# Patient Record
Sex: Female | Born: 1985 | Hispanic: Yes | Marital: Single | Smoking: Current some day smoker
Health system: Southern US, Community
[De-identification: ages and names within clinical notes are randomized; demographics above are authoritative.]

## PROBLEM LIST (undated history)

## (undated) VITALS — BP 117/82 | HR 80 | Temp 96.6°F | Resp 16 | Ht 62.5 in | Wt 157.0 lb

## (undated) DIAGNOSIS — F319 Bipolar disorder, unspecified: Secondary | ICD-10-CM

## (undated) DIAGNOSIS — IMO0001 Reserved for inherently not codable concepts without codable children: Secondary | ICD-10-CM

## (undated) DIAGNOSIS — F1010 Alcohol abuse, uncomplicated: Secondary | ICD-10-CM

## (undated) DIAGNOSIS — K219 Gastro-esophageal reflux disease without esophagitis: Secondary | ICD-10-CM

## (undated) DIAGNOSIS — F419 Anxiety disorder, unspecified: Secondary | ICD-10-CM

---
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
## 2003-05-04 ENCOUNTER — Other Ambulatory Visit: Admission: RE | Admit: 2003-05-04 | Discharge: 2003-05-04 | Payer: Self-pay | Admitting: Obstetrics & Gynecology

## 2003-11-28 ENCOUNTER — Inpatient Hospital Stay (HOSPITAL_COMMUNITY): Admission: AD | Admit: 2003-11-28 | Discharge: 2003-12-01 | Payer: Self-pay | Admitting: Obstetrics and Gynecology

## 2011-09-25 ENCOUNTER — Emergency Department (HOSPITAL_COMMUNITY)
Admission: EM | Admit: 2011-09-25 | Discharge: 2011-09-26 | Disposition: A | Discharge status: Other Acute Inpatient Hospital | Payer: BC Managed Care – PPO | Attending: Emergency Medicine | Admitting: Emergency Medicine

## 2011-09-25 ENCOUNTER — Encounter (HOSPITAL_COMMUNITY): Payer: Self-pay | Admitting: *Deleted

## 2011-09-25 DIAGNOSIS — F411 Generalized anxiety disorder: Secondary | ICD-10-CM | POA: Insufficient documentation

## 2011-09-25 DIAGNOSIS — F1010 Alcohol abuse, uncomplicated: Secondary | ICD-10-CM

## 2011-09-25 HISTORY — DX: Alcohol abuse, uncomplicated: F10.10

## 2011-09-25 LAB — COMPREHENSIVE METABOLIC PANEL
ALT: 10 U/L (ref 0–35)
AST: 18 U/L (ref 0–37)
Albumin: 4.1 g/dL (ref 3.5–5.2)
Alkaline Phosphatase: 85 U/L (ref 39–117)
BUN: 10 mg/dL (ref 6–23)
CO2: 27 mEq/L (ref 19–32)
Calcium: 8.9 mg/dL (ref 8.4–10.5)
Chloride: 101 mEq/L (ref 96–112)
Creatinine, Ser: 0.59 mg/dL (ref 0.50–1.10)
GFR calc Af Amer: >90 mL/min (ref >90)
GFR calc non Af Amer: >90 mL/min (ref >90)
Glucose, Bld: 80 mg/dL (ref 70–99)
Potassium: 3.6 mEq/L (ref 3.5–5.1)
Sodium: 140 mEq/L (ref 135–145)
Total Bilirubin: 0.2 mg/dL — ABNORMAL LOW (ref 0.3–1.2)
Total Protein: 7.5 g/dL (ref 6.0–8.3)

## 2011-09-25 LAB — CBC
HCT: 40.1 % (ref 36.0–46.0)
Hemoglobin: 14 g/dL (ref 12.0–15.0)
MCH: 33.5 pg (ref 26.0–34.0)
MCHC: 34.9 g/dL (ref 30.0–36.0)
MCV: 95.9 fL (ref 78.0–100.0)
Platelets: 362 10*3/uL (ref 150–400)
RBC: 4.18 MIL/uL (ref 3.87–5.11)
RDW: 12.1 % (ref 11.5–15.5)
WBC: 6.6 10*3/uL (ref 4.0–10.5)

## 2011-09-25 LAB — RAPID URINE DRUG SCREEN, HOSP PERFORMED
Amphetamines: NOT DETECTED
Barbiturates: NOT DETECTED
Benzodiazepines: NOT DETECTED
Cocaine: NOT DETECTED
Opiates: NOT DETECTED
Tetrahydrocannabinol: NOT DETECTED

## 2011-09-25 LAB — ETHANOL: Alcohol, Ethyl (B): 176 mg/dL — ABNORMAL HIGH (ref 0–11)

## 2011-09-25 LAB — ACETAMINOPHEN LEVEL: Acetaminophen (Tylenol), Serum: <15 ug/mL (ref 10–30)

## 2011-09-25 MED ORDER — FOLIC ACID 1 MG PO TABS
1.0000 mg | ORAL_TABLET | Freq: Every day | ORAL | Status: DC
  Administered 2011-09-25: 1 mg via ORAL
  Filled 2011-09-25: qty 1

## 2011-09-25 MED ORDER — ADULT MULTIVITAMIN W/MINERALS CH
1.0000 | ORAL_TABLET | Freq: Every day | ORAL | Status: DC
  Administered 2011-09-25: 1 via ORAL
  Filled 2011-09-25: qty 1

## 2011-09-25 MED ORDER — LORAZEPAM 1 MG PO TABS: 0.0000 mg | ORAL_TABLET | Freq: Two times a day (BID) | ORAL | Status: DC

## 2011-09-25 MED ORDER — VITAMIN B-1 100 MG PO TABS
100.0000 mg | ORAL_TABLET | Freq: Every day | ORAL | Status: DC
  Administered 2011-09-25: 100 mg via ORAL
  Filled 2011-09-25: qty 1

## 2011-09-25 MED ORDER — THIAMINE HCL 100 MG/ML IJ SOLN: 100.0000 mg | Freq: Every day | INTRAMUSCULAR | Status: DC

## 2011-09-25 MED ORDER — LORAZEPAM 1 MG PO TABS: 0.0000 mg | ORAL_TABLET | Freq: Four times a day (QID) | ORAL | Status: DC

## 2011-09-25 MED ORDER — LORAZEPAM 1 MG PO TABS
1.0000 mg | ORAL_TABLET | Freq: Four times a day (QID) | ORAL | Status: DC | PRN
  Administered 2011-09-25: 1 mg via ORAL
  Filled 2011-09-25: qty 1

## 2011-09-25 MED ORDER — LORAZEPAM 2 MG/ML IJ SOLN: 1.0000 mg | Freq: Four times a day (QID) | INTRAMUSCULAR | Status: DC | PRN

## 2011-09-25 NOTE — ED Notes (Signed)
Pt's mother at bedside. Visitor wanded by security and escorted back to room to visit with pt.

## 2011-09-25 NOTE — ED Notes (Signed)
Pt requesting detox from ETOH, Last drink this morning, 2-3 drinks a day habit, job requested her to come for detox also

## 2011-09-25 NOTE — ED Provider Notes (Signed)
History     CSN: [id number]  Arrival date & time 09/25/11  1545   First MD Initiated Contact with Patient 09/25/11 1652      Chief Complaint  Patient presents with  . Medical Clearance    (Consider location/radiation/quality/duration/timing/severity/associated sxs/prior treatment) HPI Comments: Patient presents today requesting alcohol detox.  She states that she drinks an average of four 16 oz alcoholic drinks daily.  Drinking has increased over the past 3 weeks.  She states that she has also had increased anxiety over the past 3 weeks.  Her boss at work had told her that she needed to have detox after she showed up at work intoxicated.  She has never been through any type of alcohol rehab or detox in the past.  No prior history of DT's.  She denies any tremors or symptoms of withdrawal at this time.  Last alcohol drink was earlier this morning.  She uses occasional marijuana, but no other recreational drugs.  She denies SI or HI.    The history is provided by the patient.    Past Medical History  Diagnosis Date  . ETOH abuse     History reviewed. No pertinent past surgical history.  No family history on file.  History  Substance Use Topics  . Smoking status: Current Everyday Smoker  . Smokeless tobacco: Not on file  . Alcohol Use: 1.8 oz/week    3 Cans of beer per week    OB History    Grav Para Term Preterm Abortions TAB SAB Ect Mult Living                  Review of Systems  Gastrointestinal: Negative for nausea and vomiting.  Neurological: Negative for tremors, syncope and headaches.  Psychiatric/Behavioral: Negative for suicidal ideas, hallucinations, confusion and dysphoric mood. The patient is nervous/anxious.     Allergies  Penicillins  Home Medications  No current outpatient prescriptions on file.  BP 122/83  Pulse 86  Temp 98.1 F (36.7 C) (Oral)  Resp 16  SpO2 96%  Physical Exam  Nursing note and vitals reviewed. Constitutional: She appears  well-developed and well-nourished. No distress.  HENT:  Head: Normocephalic and atraumatic.  Eyes: EOM are normal. Pupils are equal, round, and reactive to light.  Neck: Normal range of motion. Neck supple.  Cardiovascular: Normal rate, regular rhythm and normal heart sounds.   Pulmonary/Chest: Effort normal and breath sounds normal.  Neurological: She is alert.  Skin: Skin is warm and dry. She is not diaphoretic.  Psychiatric: She has a normal mood and affect. Her speech is normal and behavior is normal. Cognition and memory are normal. She expresses no homicidal and no suicidal ideation. She expresses no suicidal plans and no homicidal plans.    ED Course  Procedures (including critical care time)  Labs Reviewed  COMPREHENSIVE METABOLIC PANEL - Abnormal; Notable for the following:    Total Bilirubin 0.2 (*)     All other components within normal limits  ETHANOL - Abnormal; Notable for the following:    Alcohol, Ethyl (B) 176 (*)     All other components within normal limits  CBC  ACETAMINOPHEN LEVEL  URINE RAPID DRUG SCREEN (HOSP PERFORMED)   No results found.   No diagnosis found.  Discussed with ACT team.  They report that they come assess the patient.    MDM  Patient comes in today requesting detox from alcohol.  She states that she drinks 3-4 16 oz drinks/day.  She  denies SI or HI.  Patient has been discussed with ACT team who will evaluate the patient.  CIWA orders have been placed.  Psych holding orders have also been placed.          Pascal Lux [place], PA-C 09/25/11 2349

## 2011-09-26 ENCOUNTER — Inpatient Hospital Stay (HOSPITAL_COMMUNITY)
Admission: EM | Admit: 2011-09-26 | Discharge: 2011-09-29 | DRG: 430 | Disposition: A | Discharge status: Home / Self Care | Payer: BC Managed Care – PPO | Source: Ambulatory Visit | Attending: Psychiatry | Admitting: Psychiatry

## 2011-09-26 ENCOUNTER — Encounter (HOSPITAL_COMMUNITY): Payer: Self-pay

## 2011-09-26 DIAGNOSIS — F17200 Nicotine dependence, unspecified, uncomplicated: Secondary | ICD-10-CM | POA: Diagnosis present

## 2011-09-26 DIAGNOSIS — F1210 Cannabis abuse, uncomplicated: Secondary | ICD-10-CM | POA: Diagnosis present

## 2011-09-26 DIAGNOSIS — F310 Bipolar disorder, current episode hypomanic: Secondary | ICD-10-CM | POA: Diagnosis present

## 2011-09-26 DIAGNOSIS — F1010 Alcohol abuse, uncomplicated: Secondary | ICD-10-CM | POA: Diagnosis present

## 2011-09-26 DIAGNOSIS — F3110 Bipolar disorder, current episode manic without psychotic features, unspecified: Principal | ICD-10-CM | POA: Diagnosis present

## 2011-09-26 LAB — PREGNANCY, URINE: Preg Test, Ur: NEGATIVE

## 2011-09-26 MED ORDER — ADULT MULTIVITAMIN W/MINERALS CH
1.0000 | ORAL_TABLET | Freq: Every day | ORAL | Status: DC
  Administered 2011-09-26 – 2011-09-29 (×4): 1 via ORAL
  Filled 2011-09-26 (×5): qty 1

## 2011-09-26 MED ORDER — ACETAMINOPHEN 325 MG PO TABS: 650.0000 mg | ORAL_TABLET | Freq: Four times a day (QID) | ORAL | Status: DC | PRN

## 2011-09-26 MED ORDER — DIVALPROEX SODIUM ER 500 MG PO TB24
500.0000 mg | ORAL_TABLET | Freq: Two times a day (BID) | ORAL | Status: DC
  Administered 2011-09-26 – 2011-09-29 (×7): 500 mg via ORAL
  Filled 2011-09-26 (×9): qty 1

## 2011-09-26 MED ORDER — ONDANSETRON 4 MG PO TBDP: 4.0000 mg | ORAL_TABLET | Freq: Four times a day (QID) | ORAL | Status: AC | PRN

## 2011-09-26 MED ORDER — NICOTINE 21 MG/24HR TD PT24: 21.0000 mg | MEDICATED_PATCH | Freq: Every day | TRANSDERMAL | Status: DC

## 2011-09-26 MED ORDER — MAGNESIUM HYDROXIDE 400 MG/5ML PO SUSP: 30.0000 mL | Freq: Every day | ORAL | Status: DC | PRN

## 2011-09-26 MED ORDER — HYDROXYZINE HCL 25 MG PO TABS
25.0000 mg | ORAL_TABLET | Freq: Four times a day (QID) | ORAL | Status: AC | PRN
  Administered 2011-09-27: 25 mg via ORAL

## 2011-09-26 MED ORDER — THIAMINE HCL 100 MG/ML IJ SOLN
100.0000 mg | Freq: Once | INTRAMUSCULAR | Status: AC
  Administered 2011-09-26: 100 mg via INTRAMUSCULAR

## 2011-09-26 MED ORDER — CHLORDIAZEPOXIDE HCL 25 MG PO CAPS
50.0000 mg | ORAL_CAPSULE | Freq: Once | ORAL | Status: AC
  Administered 2011-09-26: 50 mg via ORAL
  Filled 2011-09-26: qty 2

## 2011-09-26 MED ORDER — VITAMIN B-1 100 MG PO TABS
100.0000 mg | ORAL_TABLET | Freq: Every day | ORAL | Status: DC
  Administered 2011-09-27 – 2011-09-29 (×3): 100 mg via ORAL
  Filled 2011-09-26 (×4): qty 1

## 2011-09-26 MED ORDER — LOPERAMIDE HCL 2 MG PO CAPS: 2.0000 mg | ORAL_CAPSULE | ORAL | Status: AC | PRN

## 2011-09-26 MED ORDER — CHLORDIAZEPOXIDE HCL 25 MG PO CAPS
25.0000 mg | ORAL_CAPSULE | Freq: Every day | ORAL | Status: AC
  Administered 2011-09-29: 25 mg via ORAL
  Filled 2011-09-26: qty 1

## 2011-09-26 MED ORDER — CHLORDIAZEPOXIDE HCL 25 MG PO CAPS
25.0000 mg | ORAL_CAPSULE | Freq: Three times a day (TID) | ORAL | Status: AC
  Administered 2011-09-27 (×3): 25 mg via ORAL
  Filled 2011-09-26 (×2): qty 1

## 2011-09-26 MED ORDER — TRAZODONE HCL 50 MG PO TABS: 50.0000 mg | ORAL_TABLET | Freq: Every evening | ORAL | Status: DC | PRN

## 2011-09-26 MED ORDER — CHLORDIAZEPOXIDE HCL 25 MG PO CAPS: 25.0000 mg | ORAL_CAPSULE | Freq: Four times a day (QID) | ORAL | Status: AC | PRN

## 2011-09-26 MED ORDER — ALUM & MAG HYDROXIDE-SIMETH 200-200-20 MG/5ML PO SUSP: 30.0000 mL | ORAL | Status: DC | PRN

## 2011-09-26 MED ORDER — CHLORDIAZEPOXIDE HCL 25 MG PO CAPS
25.0000 mg | ORAL_CAPSULE | ORAL | Status: AC
  Administered 2011-09-28 (×2): 25 mg via ORAL
  Filled 2011-09-26 (×2): qty 1

## 2011-09-26 MED ORDER — CHLORDIAZEPOXIDE HCL 25 MG PO CAPS
25.0000 mg | ORAL_CAPSULE | Freq: Four times a day (QID) | ORAL | Status: AC
  Administered 2011-09-26 (×2): 25 mg via ORAL
  Filled 2011-09-26 (×3): qty 1

## 2011-09-26 NOTE — BHH Counselor (Signed)
Adult Comprehensive Assessment  Patient ID: Tanya Wilson, female   DOB: [birth year], 26 y.o.   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 Family Relationships: Some strain due to patient drinking Surveyor, quantity / Lack of resources (include bankruptcy): NA Housing / Lack of housing: NA Physical health (include injuries & life threatening diseases): nothing Social relationships: isolates Substance abuse: history Bereavement / Loss: GP, Aunts  Living/Environment/Situation:  Living Arrangements: Spouse/significant other and son Living conditions (as described by patient or guardian): likes the home  How long has patient lived in current situation?: 5 years What is atmosphere in current home: Comfortable  Family History:  Marital status: Long term relationship Long term relationship, how long?: 8 years What types of issues is patient dealing with in the relationship?: Patient's drinking Additional relationship information: Engaged, feels like her drinking may change that if she does not stop Does patient have children?: Yes How many children?: 1  How is patient's relationship with their children?: Good w 7 YO son  Childhood History:  By whom was/is the patient raised?: Both parents Additional childhood history information: "Perfect childhood although they may have spoiled me" Description of patient's relationship with caregiver when they were a child: Good Patient's description of current relationship with people who raised him/her: "Get along really well w both; they are concerned about my drinking" Does patient have siblings?: Yes Number of Siblings: 2  Description of patient's current relationship with siblings: "Good with one; other strained due to her disapproval of my drinking" Did patient suffer any  verbal/emotional/physical/sexual abuse as a child?: No Did patient suffer from severe childhood neglect?: No Has patient ever been sexually abused/assaulted/raped as an adolescent or adult?: No Was the patient ever a victim of a crime or a disaster?: No Witnessed domestic violence?: No Has patient been effected by domestic violence as an adult?: No  Education:  Highest grade of school patient has completed: some college Currently a Consulting civil engineer?: No Learning disability?: No  Employment/Work Situation:   Employment situation: Employed 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) How long has patient been employed?: 2 years  Patient's job has been impacted by current illness: Yes Describe how patient's job has been impacted: "Told not to return unless I complete detox as I was caught with alcohol on my breath 3 times in last 2 months" What is the longest time patient has a held a job?: 5 years Where was the patient employed at that time?: [employer] Has patient ever been in the [employer]?: No Has patient ever served in combat?: No  Financial Resources:   Financial resources: Income from employment;Income from spouse  Alcohol/Substance Abuse:   What has been your use of drugs/alcohol within the last 12 months?: Last month or more patient drinking 4 16 oz daily plus one 40 oz every other day If attempted suicide, did drugs/alcohol play a role in this?: No Alcohol/Substance Abuse Treatment Hx: Denies past history Has alcohol/substance abuse ever caused legal problems?: Yes (2 DUIs)  Social Support System:   Patient's Community Support System: Good Describe Community Support System: Josh and family Type of faith/religion: Catholic How does patient's faith help to cope with current illness?: Open minded  Leisure/Recreation:   Leisure and Hobbies: "Shopping"  Strengths/Needs:   What things does the patient do well?: "Good cook, good speaker, communicates well" In what  areas does patient  struggle / problems for patient: "Difficulty dealing with emotions, especially anger; alcohol"  Discharge Plan:   Does patient have access to transportation?: Yes Will patient be returning to same living situation after discharge?: Yes Currently receiving community mental health services: No If no, would patient like referral for services when discharged?: Yes (What county[place] [facility] [place]) Does patient have financial barriers related to discharge medications?: No  Summary/Recommendations:   Summary and Recommendations (to be completed by the evaluator): Patient is 26 YO married and employed female admitted with diagnosis of Alcohol Abuse.  Patient reports he drinking is having negative effect on her self esteem, marriage and employment. Patient will benefit from crisis stabilization, medication evaluation, group therapy and psycho ed groups, in addition to case management for discharge planning.   Clide Dales. 09/26/2011

## 2011-09-26 NOTE — Progress Notes (Signed)
Psychoeducational Group Note  Date:  09/26/2011 Time: 1000  Group Topic/Focus:  Recovery Goals:   The focus of this group is to identify appropriate goals for recovery and establish a plan to achieve them.  Participation Level: Did Not Attend  Participation Quality:  Not Applicable  Affect:  Not Applicable  Cognitive:  Not Applicable  Insight:  Not Applicable  Engagement in Group: Not Applicable  Additional Comments:  Pt did not attend group. New admission on the unit.  Tanya Wilson Brittini 09/26/2011, 3:23 PM

## 2011-09-26 NOTE — BHH Counselor (Signed)
Pt has been accepted by Donell Sievert, PA- Dr. Catha Brow, [phone].

## 2011-09-26 NOTE — Tx Team (Signed)
Interdisciplinary Treatment Plan Update (Adult)  Date:  09/26/2011  Time Reviewed:  10:57 AM   Progress in Treatment: Attending groups: Yes Participating in groups:  Yes Taking medication as prescribed: Yes Tolerating medication:  Yes Family/Significant othe contact made:  Counselor assessing for appropriate contact Patient understands diagnosis:  Yes Discussing patient identified problems/goals with staff:  Yes Medical problems stabilized or resolved:  Yes Denies suicidal/homicidal ideation: Yes Issues/concerns per patient self-inventory:  None identified Other: N/A  New problem(s) identified: None Identified  Reason for Continuation of Hospitalization: Anxiety Medication stabilization Withdrawal symptoms  Interventions implemented related to continuation of hospitalization: mood stabilization, medication monitoring and adjustment, group therapy and psycho education, safety checks q 15 mins  Additional comments: N/A  Estimated length of stay: 3-5 days  Discharge Plan: SW assessing for appropriate contact  New goal(s): N/A  Review of initial/current patient goals per problem list:    1.  Goal(s): Address substance use  Met:  No  Target date: by discharge  As evidenced by: completing detox protocol and refer to appropriate treatment  2.  Goal (s): Reduce anxiety symptoms  Met:  No  Target date: by discharge  As evidenced by: Reducing depression from a 10 to a 3 as reported by pt.     Attendees: Patient:  Tanya Wilson 09/26/2011 10:57 AM   Family:     Physician:  Lupe Carney, DO 09/26/2011 10:57 AM   Nursing: Roswell Miners, RN 09/26/2011 10:57 AM   Case Manager:  Reyes Ivan, LCSWA 09/26/2011  10:57 AM   Counselor:  Ronda Fairly, LCSWA 09/26/2011  10:57 AM   Other:  Richelle Ito, LCSW 09/26/2011 10:57 AM   Other:  Robbie Louis, RN 09/26/2011 10:57 AM   Other:  Caroleen Hamman, NP 09/26/2011 10:57 AM   Other:      Scribe for Treatment Team:   Reyes Ivan 09/26/2011 10:57 AM

## 2011-09-26 NOTE — BH Assessment (Addendum)
Assessment Note   Tanya Wilson is a 26 y.o. female who presents to [facility] for detox from alcohol.  Pt denies SI/HI/Psych.  Pt says drinks 4-16oz cans of beer and 1-40oz every other day with the beer, pt also uses THC, stating she only takes 2 "hits" 2x's wkly.  Pt reports she is experiencing tremors, chills and fever. Pt.'s last drink was 09/25/11, she had 1-16oz can of beer.  Pt says she has been intoxicated on her job for the last 2-3 wks.  Pt.'s supervisor(pt is employed by [employer] x28yrs) has caught pt intoxicated at least 3x's for the last 2-3wks and told her she needed detox. Pt has no legal issues due to alcohol abuse and motivated to seek detox to keep her job.  Pt has no hx of detox tx.   Axis I: Alcohol Abuse Axis II: Deferred Axis III:  Past Medical History  Diagnosis Date  . ETOH abuse    Axis IV: other psychosocial or environmental problems, problems related to social environment and problems with primary support group Axis V: 51-60 moderate symptoms  Past Medical History:  Past Medical History  Diagnosis Date  . ETOH abuse     History reviewed. No pertinent past surgical history.  Family History: No family history on file.  Social History:  reports that she has been smoking.  She does not have any smokeless tobacco history on file. She reports that she drinks about 1.8 ounces of alcohol per week. She reports that she uses illicit drugs (Marijuana).  Additional Social History:  Alcohol / Drug Use Pain Medications: None  Prescriptions: None  Over the Counter: None  History of alcohol / drug use?: Yes Longest period of sobriety (when/how long): None  Substance #1 Name of Substance 1: Alcohol  1 - Age of First Use: Teens  1 - Amount (size/oz): 4-16oz; 1-40oz every other day  1 - Frequency: Daily  1 - Duration: On-going  1 - Last Use / Amount: 09/25/11 Substance #2 Name of Substance 2: THC  2 - Age of First Use: Teens  2 - Amount (size/oz): 2  "Hits" 2 - Frequency: 2x's wkly  2 - Duration: On-going  2 - Last Use / Amount: Unk   CIWA: CIWA-Ar BP: 126/88 mmHg Pulse Rate: 67  Nausea and Vomiting: no nausea and no vomiting Tactile Disturbances: none Tremor: not visible, but can be felt fingertip to fingertip Auditory Disturbances: not present Paroxysmal Sweats: no sweat visible Visual Disturbances: not present Anxiety: no anxiety, at ease Headache, Fullness in Head: none present Agitation: normal activity Orientation and Clouding of Sensorium: oriented and can do serial additions CIWA-Ar Total: 1  COWS:    Allergies:  Allergies  Allergen Reactions  . Penicillins Hives    Home Medications:  (Not in a hospital admission)  OB/GYN Status:  No LMP recorded. Patient is not currently having periods (Reason: IUD).  General Assessment Data Location of Assessment: [facility] Living Arrangements: Alone Can pt return to current living arrangement?: Yes Admission Status: Voluntary Is patient capable of signing voluntary admission?: Yes Transfer from: Acute Hospital Referral Source: MD  Education Status Is patient currently in school?: No Current Grade: None  Highest grade of school patient has completed: None  Name of school: None  Contact person: None   Risk to self Suicidal Ideation: No Suicidal Intent: No Is patient at risk for suicide?: No Suicidal Plan?: No Access to Means: No What has been your use of drugs/alcohol within the last 12  months?: Abusing Alcohol & THC  Previous Attempts/Gestures: No How many times?: 0  Other Self Harm Risks: None  Triggers for Past Attempts: None known Intentional Self Injurious Behavior: None Family Suicide History: No Recent stressful life event(s): Other (Comment) (Issues with intoxication at work ) Persecutory voices/beliefs?: No Depression: No Depression Symptoms:  (None Reported ) Substance abuse history and/or treatment for substance abuse?: No Suicide prevention  information given to non-admitted patients: Not applicable  Risk to Others Homicidal Ideation: No Thoughts of Harm to Others: No Current Homicidal Intent: No Current Homicidal Plan: No Access to Homicidal Means: No Identified Victim: None  History of harm to others?: No Assessment of Violence: None Noted Violent Behavior Description: None  Does patient have access to weapons?: No Criminal Charges Pending?: No Does patient have a court date: No  Psychosis Hallucinations: None noted Delusions: None noted  Mental Status Report Appear/Hygiene: Other (Comment) (Appropriate ) Eye Contact: Good Motor Activity: Unremarkable Speech: Logical/coherent Level of Consciousness: Alert Mood: Other (Comment) (Appropriate ) Affect: Appropriate to circumstance Anxiety Level: None Thought Processes: Coherent;Relevant Judgement: Unimpaired Orientation: Person;Place;Time;Situation Obsessive Compulsive Thoughts/Behaviors: None  Cognitive Functioning Concentration: Normal Memory: Recent Intact;Remote Intact IQ: Average Insight: Good Impulse Control: Good Appetite: Good Weight Loss: 0  Weight Gain: 0  Sleep: No Change Total Hours of Sleep: 6  Vegetative Symptoms: None  ADLScreening [facility] Assessment Services) Patient's cognitive ability adequate to safely complete daily activities?: Yes Patient able to express need for assistance with ADLs?: Yes Independently performs ADLs?: Yes (appropriate for developmental age)  Abuse/Neglect [facility]) Physical Abuse: Denies Verbal Abuse: Denies Sexual Abuse: Denies  Prior Inpatient Therapy Prior Inpatient Therapy: No Prior Therapy Dates: None  Prior Therapy Facilty/Provider(s): None  Reason for Treatment: None   Prior Outpatient Therapy Prior Outpatient Therapy: No Prior Therapy Dates: None  Prior Therapy Facilty/Provider(s): None  Reason for Treatment: None   ADL Screening (condition at time of admission) Patient's cognitive ability  adequate to safely complete daily activities?: Yes Patient able to express need for assistance with ADLs?: Yes Independently performs ADLs?: Yes (appropriate for developmental age) Weakness of Legs: None Weakness of Arms/Hands: None  Home Assistive Devices/Equipment Home Assistive Devices/Equipment: None  Therapy Consults (therapy consults require a physician order) PT Evaluation Needed: No OT Evalulation Needed: No SLP Evaluation Needed: No Abuse/Neglect Assessment (Assessment to be complete while patient is alone) Physical Abuse: Denies Verbal Abuse: Denies Sexual Abuse: Denies Exploitation of patient/patient's resources: Denies Self-Neglect: Denies Values / Beliefs Cultural Requests During Hospitalization: None Spiritual Requests During Hospitalization: None Consults Spiritual Care Consult Needed: No Social Work Consult Needed: No Merchant navy officer (For Healthcare) Advance Directive: Patient does not have advance directive;Patient would not like information Pre-existing out of facility DNR order (yellow form or pink MOST form): No Nutrition Screen- [facility] Patient's home diet: Regular Have you recently lost weight without trying?: No Have you been eating poorly because of a decreased appetite?: No Malnutrition Screening Tool Score: 0   Additional Information 1:1 In Past 12 Months?: No CIRT Risk: No Elopement Risk: No Does patient have medical clearance?: Yes     Disposition:  Disposition Disposition of Patient: Inpatient treatment program;Referred to [facility] ) Type of inpatient treatment program: Adult Patient referred to:  [facility] )  On Site Evaluation by:   Reviewed with Physician:     Murrell Redden 09/26/2011 1:59 AM

## 2011-09-26 NOTE — H&P (Signed)
Psychiatric Admission Assessment Adult  Patient Identification:  Tanya Wilson  Date of Evaluation:  09/26/2011  Chief Complaint:  Alcohol Abuse  History of Present Illness: This is a 26 year old Hispanic female, admitted to [facility] from the [facility] ED with complaints of alcohol abuse and binging, requesting a detoxification treatment. Patient reports, "My Mom took me to the [facility] ED yesterday because I have been dangerously alcohol binging for the past 3 weeks. I guess I was stressed from my job. 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 And to relieve these stresses, I binge drink alcohol. I have been drinking alcohol since I was 26 years old. My drinking has escalated to the point that I'm not only drinking daily, I also drink at work. My supervisor has ordered me to get detoxification treatment or else, I would not have a job. When I drink, I don't feel my stress, I feel happier, and I don't worry about anything else. I lost my driver's license x 1 year now. I have to go the [facility] have it reinstated. But, I know if it is reinstated, it will not be for an extended amount of time before I will lose it again because I have not stooped drinking alcohol still. I have anger problems too. And my fiance knows how to instigate something to get me upset. When I get angry, I will turn to drinking alcohol to stay calm. I was diagnosed with bipolar disorder when I was a teenager. At the time, I saw a psychiatrist who put me on medications. I don't remember the name of the psychiatrist or the medications that I was on. I am not depressed, but sad. I do have mood swings, happy one minute, depressingly sad the next. I have problems sleeping at night because of racing thoughts. I am not suicidal, homicidal or delusional. I don't see or hear stuff".  ROS: Negative for fever.  HENT: Negative for congestion and rhinorrhea.  Respiratory: Negative for cough, chest tightness and shortness of  breath.  Cardiovascular: Negative for chest pain.  Gastrointestinal: Negative for nausea, vomiting and abdominal pain.  Skin: Negative for rash. Multiple body piecings, mostly facial areas. Neurological: Negative for weakness and headaches     Mood Symptoms:  Hypomania/Mania, Mood Swings, Sadness,  Depression Symptoms:  anxiety, insomnia, racing thoughts  (Hypo) Manic Symptoms:  Impulsivity, Irritable Mood,  Anxiety Symptoms:  Excessive Worry,  Psychotic Symptoms:  Hallucinations: None  PTSD Symptoms: Had a traumatic exposure:  Patient denies any traumatic events  Past Psychiatric History: Diagnosis: Alcohol abuse continuous, Hx. Bipolar disorder.  Hospitalizations: [facility]  Outpatient Care: "I don't have one"  Substance Abuse Care: None reported  Self-Mutilation: Denies self mutilation.  Suicidal Attempts: Denies attempts and or thoughts.  Violent Behaviors: Denies any violent behaviors.    Past Medical History:   Past Medical History  Diagnosis Date  . ETOH abuse      Allergies:   Allergies  Allergen Reactions  . Penicillins Hives   PTA Medications: No prescriptions prior to admission     Substance Abuse History in the last 12 months: Substance Age of 1st Use Last Use Amount Specific Type  Nicotine 14 "I only smoke if I'm drinking alcohol" 8 cigarettes daily Cigarettes  Alcohol 13 Prior to hosp 4 cans of malt-liquor daily Liquor  Cannabis  14 "I smoke occasionally, may be 2 hits at a time" 2 hits occasionally THC  Opiates Denies use     Cocaine  Denies use     Methamphetamines Denies use     LSD Denies use     Ecstasy Denies use     Benzodiazepines Denies use     Caffeine      Inhalants      Others:                         Consequences of Substance Abuse: Medical Consequences:  Liver damage Legal Consequences:  Arrests, jail time Family Consequences:  Family discord  Social History: Current Place of Residence: [place], [place]  Place of Birth:  [place], [place]   Family Members: "I have a son, and my fiance"  Marital Status:  Single  Children:1  Sons:1  Daughters:0  Relationships: "I have a fiance"  Education:  HS Graduate  Educational Problems/Performance: None reported  Religious Beliefs/Practices: None reported  History of Abuse (Emotional/Phsycial/Sexual): none reported  Occupational Experiences: Employed  Hotel manager History:  None.  Legal History: "Yes, DUI charges x 2, lost my driver's license x 1 year'  Hobbies/Interests: None reported.  Family History:  History reviewed. No pertinent family history.  Mental Status Examination/Evaluation: Objective:  Appearance: Casual  Eye Contact::  Good  Speech:  Clear and Coherent  Volume:  Normal  Mood:  "I am not depressed, rather sad"  Affect:  Appropriate  Thought Process:  Coherent, Intact and Logical  Orientation:  Full  Thought Content:  Denies delusions, and or hallucinations.  Suicidal Thoughts:  No  Homicidal Thoughts:  No  Memory:  Immediate;   Good Recent;   Good Remote;   Good  Judgement:  Poor  Insight:  Good  Psychomotor Activity:  Normal  Concentration:  Good  Recall:  Good  Akathisia:  No  Handed:  Right  AIMS (if indicated):     Assets:  Desire for Improvement  Sleep:       Laboratory/X-Ray: None Psychological Evaluation(s)      Assessment:    AXIS I:  Alcohol abuse continuous. AXIS II:  Deferred AXIS III:   Past Medical History  Diagnosis Date  . ETOH abuse    AXIS IV:  Substance abuse problems. AXIS V:  41-50 serious symptoms  Treatment Plan/Recommendations: Admit for safety and stabilization. Review and reinstate any pertinent home medications for other medical issues. Initiate Librium protocol for alcohol detoxification. Start Depakote ER 500 mg bid for mood stabilizations. Encourage and allow patient participate in AA/NA meetings being offered on this unit.  Treatment Plan Summary: Daily contact with patient to  assess and evaluate symptoms and progress in treatment Medication management  Current Medications:  No current facility-administered medications for this encounter.   Facility-Administered Medications Ordered in Other Encounters  Medication Dose Route Frequency Provider Last Rate Last Dose  . DISCONTD: acetaminophen (TYLENOL) tablet 650 mg  650 mg Oral Q6H PRN Kerry Hough, PA      . DISCONTD: alum & mag hydroxide-simeth (MAALOX/MYLANTA) 200-200-20 MG/5ML suspension 30 mL  30 mL Oral Q4H PRN Kerry Hough, PA      . DISCONTD: folic acid (FOLVITE) tablet 1 mg  1 mg Oral Daily Pascal Lux Wingen, PA-C   1 mg at 09/25/11 1828  . DISCONTD: LORazepam (ATIVAN) injection 1 mg  1 mg Intravenous Q6H PRN Pascal Lux Wingen, PA-C      . DISCONTD: LORazepam (ATIVAN) tablet 0-4 mg  0-4 mg Oral Q6H Heather Van Wingen, PA-C      . DISCONTD: LORazepam (ATIVAN) tablet 0-4 mg  0-4 mg Oral Q12H Heather Van Croom, PA-C      . DISCONTD: LORazepam (ATIVAN) tablet 1 mg  1 mg Oral Q6H PRN Pascal Lux Wingen, PA-C   1 mg at 09/25/11 2220  . DISCONTD: magnesium hydroxide (MILK OF MAGNESIA) suspension 30 mL  30 mL Oral Daily PRN Kerry Hough, PA      . DISCONTD: multivitamin with minerals tablet 1 tablet  1 tablet Oral Daily Magnus Sinning, PA-C   1 tablet at 09/25/11 1828  . DISCONTD: nicotine (NICODERM CQ - dosed in mg/24 hours) patch 21 mg  21 mg Transdermal Q0600 Kerry Hough, PA      . DISCONTD: thiamine (B-1) injection 100 mg  100 mg Intravenous Daily Pascal Lux Wingen, PA-C      . DISCONTD: thiamine (VITAMIN B-1) tablet 100 mg  100 mg Oral Daily Pascal Lux Wingen, PA-C   100 mg at 09/25/11 1828  . DISCONTD: traZODone (DESYREL) tablet 50 mg  50 mg Oral QHS,MR X 1 Kerry Hough, PA        Observation Level/Precautions:  Q 15 minute checks for safety  Laboratory:  Per ED lab findinds, (+) for alcohol level of 176.  Psychotherapy:  Group counseling sessions  Medications: See medication lists     Routine PRN Medications:  Yes  Consultations:  None indicated at this time.  Discharge Concerns:  Safety  Other:     Armandina Stammer I 9/3/201310:11 AM

## 2011-09-26 NOTE — ED Notes (Signed)
ACT at bedside 

## 2011-09-26 NOTE — [facility] Suicide Risk Assessment (Signed)
Suicide Risk Assessment  Admission Assessment      Demographic factors:  Adolescent or young adult (adult)  Current Mental Status: Patient seen and evaluated. Chart reviewed. Patient stated that her mood was "ok". Her affect was mood congruent and appropriate. She denied any current thoughts of self injurious behavior, suicidal ideation or homicidal ideation. There were no auditory or visual hallucinations, paranoia, delusional thought processes, or mania noted.  Thought process was linear and goal directed.  No psychomotor agitation or retardation was noted. Speech was normal rate, tone and volume. Eye contact was good. Judgment and insight are fair.  Patient has been up and engaged on the unit.  No acute safety concerns reported from team.    Loss Factors: trouble with employer over use at work  Historical Factors:  Denied sig medical problems; no hx w/d seizures or DTs; no recent meds; BPAD during teenage yrs noted  Risk Reduction Factors:  Responsible for children under 74 years of age;Sense of responsibility to family;Employed;Living with another person, especially a relative;Positive social support;Positive therapeutic relationship; child and sig other; employment  CLINICAL FACTORS: Alcohol Use Disorder; Cannabis Use Disorder, mild; Mood Disorder Unspecified; BPAD, per Hx    COGNITIVE FEATURES THAT CONTRIBUTE TO RISK: limited insight; impulsivity.  SUICIDE RISK: Pt viewed as a chronic increased risk of harm to self in light of her past hx and risk factors.  No acute safety concerns on the unit.  Pt contracting for safety and in need of crisis stabilization & Tx.  PLAN OF CARE: Pt admitted for crisis stabilization and treatment.  Please see orders.   Medications reviewed with pt and medication education provided.  Will continue q15 minute checks per unit protocol.  No clinical indication for one on one level of observation at this time.  Pt contracting for safety.  Mental health treatment,  medication management and continued sobriety will mitigate against the increased risk of harm to self and/or others.  Discussed the importance of recovery with pt, as well as, tools to move forward in a healthy & safe manner.  Pt agreeable with the plan.  Discussed with the team.   Lupe Carney 09/26/2011, 5:05 PM

## 2011-09-26 NOTE — ED Provider Notes (Signed)
  Physical Exam  BP 138/87  Pulse 68  Temp 97.7 F (36.5 C) (Oral)  Resp 16  SpO2 99%  Physical Exam  ED Course  Procedures  MDM The patient has been accepted in behavioral health, transfer documentation completed      Vida Roller, MD 09/26/11 [phone]

## 2011-09-26 NOTE — Progress Notes (Signed)
Pt attended discharge planning group and actively participated in group.  SW provided pt with today's workbook.  Pt presents with flat affect and depressed mood.  Pt was open with sharing reason for entering the hospital.  Pt states that she was drinking too much and was going to work under the influence.  Pt states that her work supports her being here and getting help.  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  Pt states that she doesn't want rehab but is open with going to [facility].  SW gave pt information on [facility] at [facility].  SW will assess for appropriate referrals.  Pt states that she lives in [place] with her fiance and son.  Pt states that she has transportation home.  Pt denies having depression and SI/HI.  Pt rates anxiety at an 8 today.  No further needs voiced by pt at this time.  Safety planning and suicide prevention discussed.  Pt participated in discussion and acknowledged an understanding of the information provided.       Sajan Cheatwood Horton, LCSWA 09/26/2011  10:00 AM

## 2011-09-26 NOTE — H&P (Signed)
Pt seen and evaluated upon admission.  Completed Admission Suicide Risk Assessment.  See orders.  Pt agreeable with plan.  Discussed with team.   

## 2011-09-26 NOTE — Progress Notes (Signed)
[facility] Group Notes:  (Counselor/Nursing/MHT/Case Management/Adjunct)  09/26/2011 3:50 PM  Type of Therapy:  Psychoeducational Skills  Participation Level:  Minimal  Participation Quality:  Appropriate and Attentive  Affect:  Blunted  Cognitive:  Alert, Appropriate and Oriented  Insight:  Good  Engagement in Group:  Limited  Engagement in Therapy:  n/a  Modes of Intervention:  Activity, Education, Problem-solving, Socialization and Support  Summary of Progress/Problems: Tanya Wilson attended Psychoeducational group that focused on using quality time with support systems/individuals to engage in healthy coping skills. Tanya Wilson participated in activity guessing about self and peers. Tanya Wilson was gaurded while group discussed who their supports are, how they can spend positive quality time with them as a coping skill and a way to strengthen their relationship. She was given a homework assignment to find two ways to improve her support systems and 20 activities she can do to spend quality time with supports.    Tanya Wilson 09/26/2011, 3:50 PM

## 2011-09-26 NOTE — ED Notes (Signed)
Waiting for Urine Pregnancy to result before transporting pt to [facility]

## 2011-09-26 NOTE — Progress Notes (Signed)
09/26/2011         Time: 1500      Group Topic/Focus: The focus of the group is on enhancing the patients' ability to utilize positive relaxation strategies by practicing several that can be used at discharge.  Participation Level: Active  Participation Quality: Appropriate and Attentive  Affect: Appropriate  Cognitive: Oriented   Additional Comments: Patient reports being very tired, but still attended group. Patient practiced relaxation techniques, was able to identify positive techniques she can use upon discharge to help relax.   Renda Pohlman 09/26/2011 3:50 PM

## 2011-09-27 DIAGNOSIS — F1010 Alcohol abuse, uncomplicated: Secondary | ICD-10-CM

## 2011-09-27 NOTE — Progress Notes (Signed)
Read and reviewed. 

## 2011-09-27 NOTE — Progress Notes (Signed)
[facility] In Patient Progress Note  09/27/2011 2:49 PM Tanya Wilson [birth year] [id number]  Diagnosis:  Axis I: Alcohol Use Disorder; Cannabis Use Disorder, mild; Mood Disorder Unspecified; BPAD, per Hx   ADL's:  Intact  Sleep:  Yes,  AEB:  Appetite: good  Suicidal Ideation: No suicidal ideation, no plan, no intent, no means.  Homicidal Ideation:  No homicidal ideation, no plan, no intent, no means.  Subjective: The patient is up and active in the unit milieu.  She is doing well and has no new complaints. She is anticipating discharge tomorrow or the next day. She reports no symptoms of withdrawal at this time.   height is 5' 2.5" (1.588 m) and weight is 71.215 kg (157 lb). Her oral temperature is 97.9 F (36.6 C). Her blood pressure is 112/79 and her pulse is 74. Her respiration is 16.   Objective: the patient is alert and oriented, reports no new symptoms of withdrawal. She has considered the possibility of going into [facility] upon discharge if her employer will support her doing this. Mental Status: awake Level of Consciousness:  Alert Orientation: x 3 General Appearance : neat and groomed Behavior:  cooperative Eye Contact:  Good Motor Behavior:  Tremor Speech:  Normal Mood:  Euthymic Affect:  Appropriate Anxiety Level:  None Thought Process:  Coherent Thought Content:  WNL Perception:  Normal Judgment:  Fair Insight:  Present Cognition:  At least average Sleep:  Number of Hours: 6.25   Lab Results:  Results for orders placed during the hospital encounter of 09/25/11 (from the past 48 hour(s))  CBC     Status: Normal   Collection Time   09/25/11  4:15 PM      Component Value Range Comment   WBC 6.6  4.0 - 10.5 K/uL    RBC 4.18  3.87 - 5.11 MIL/uL    Hemoglobin 14.0  12.0 - 15.0 g/dL    HCT 40.9  81.1 - 91.4 %    MCV 95.9  78.0 - 100.0 fL    MCH 33.5  26.0 - 34.0 pg    MCHC 34.9  30.0 - 36.0 g/dL    RDW 78.2  95.6 - 21.3 %    Platelets 362  150 - 400 K/uL     COMPREHENSIVE METABOLIC PANEL     Status: Abnormal   Collection Time   09/25/11  4:15 PM      Component Value Range Comment   Sodium 140  135 - 145 mEq/L    Potassium 3.6  3.5 - 5.1 mEq/L    Chloride 101  96 - 112 mEq/L    CO2 27  19 - 32 mEq/L    Glucose, Bld 80  70 - 99 mg/dL    BUN 10  6 - 23 mg/dL    Creatinine, Ser 0.86  0.50 - 1.10 mg/dL    Calcium 8.9  8.4 - 57.8 mg/dL    Total Protein 7.5  6.0 - 8.3 g/dL    Albumin 4.1  3.5 - 5.2 g/dL    AST 18  0 - 37 U/L    ALT 10  0 - 35 U/L    Alkaline Phosphatase 85  39 - 117 U/L    Total Bilirubin 0.2 (*) 0.3 - 1.2 mg/dL    GFR calc non Af Amer >90  >90 mL/min    GFR calc Af Amer >90  >90 mL/min   ETHANOL     Status: Abnormal   Collection Time  09/25/11  4:15 PM      Component Value Range Comment   Alcohol, Ethyl (B) 176 (*) 0 - 11 mg/dL   ACETAMINOPHEN LEVEL     Status: Normal   Collection Time   09/25/11  4:15 PM      Component Value Range Comment   Acetaminophen (Tylenol), Serum <15.0  10 - 30 ug/mL   URINE RAPID DRUG SCREEN (HOSP PERFORMED)     Status: Normal   Collection Time   09/25/11  4:49 PM      Component Value Range Comment   Opiates NONE DETECTED  NONE DETECTED    Cocaine NONE DETECTED  NONE DETECTED    Benzodiazepines NONE DETECTED  NONE DETECTED    Amphetamines NONE DETECTED  NONE DETECTED    Tetrahydrocannabinol NONE DETECTED  NONE DETECTED    Barbiturates NONE DETECTED  NONE DETECTED   PREGNANCY, URINE     Status: Normal   Collection Time   09/26/11  4:20 AM      Component Value Range Comment   Preg Test, Ur NEGATIVE  NEGATIVE    Labs are reviewed. Physical Findings: AIMS: Facial and Oral Movements Muscles of Facial Expression: None, normal Lips and Perioral Area: None, normal Jaw: None, normal Tongue: None, normal,Extremity Movements Upper (arms, wrists, hands, fingers): None, normal Lower (legs, knees, ankles, toes): None, normal, Trunk Movements Neck, shoulders, hips: None, normal, Overall  Severity Severity of abnormal movements (highest score from questions above): None, normal Incapacitation due to abnormal movements: None, normal Patient's awareness of abnormal movements (rate only patient's report): No Awareness, Dental Status Current problems with teeth and/or dentures?: No Does patient usually wear dentures?: No  CIWA:  CIWA-Ar Total: 0  COWS:    Medication:  . chlordiazePOXIDE  25 mg Oral QID   Followed by  . chlordiazePOXIDE  25 mg Oral TID   Followed by  . chlordiazePOXIDE  25 mg Oral BH-qamhs   Followed by  . chlordiazePOXIDE  25 mg Oral Daily  . divalproex  500 mg Oral BID  . multivitamin with minerals  1 tablet Oral Daily  . thiamine  100 mg Oral Daily   Treatment Plan Summary: Daily contact with patient to assess and evaluate symptoms and progress in treatment Medication management  Plan: 1. Continue with the current plan of care at this time with no changes. 2. Pt will meet with [facility] director Charmian Muff today to discuss going into the [facility] program.  Lloyd Huger T. Adil Tugwell PAC 09/27/2011, 2:49 PM

## 2011-09-27 NOTE — Progress Notes (Signed)
Psychoeducational Group Note  Date:  09/27/2011 Time:  1100  Group Topic/Focus:  Personal Choices and Values:   The focus of this group is to help patients assess and explore the importance of values in their lives, how their values affect their decisions, how they express their values and what opposes their expression.  Participation Level:  Active  Participation Quality:  Appropriate, Attentive and Sharing  Affect:  Appropriate  Cognitive:  Alert and Appropriate  Insight:  Good  Engagement in Group:  Good  Additional Comments:  Pt was appropriate and attentive while attending group. Pt shared that having a close family and being a good parent were important values in her life. Pt stated that her family are a very strong support system for her and that they are proud that she is seeking help.   Sharyn Lull 09/27/2011, 1:22 PM

## 2011-09-27 NOTE — Progress Notes (Signed)
Psychoeducational Group Note  Date:  09/27/2011 Time: 2000  Group Topic/Focus:  Wrap-Up Group:   AA group Participation Level:  Active  Participation Quality:  Appropriate  Affect:  Appropriate  Cognitive:  Appropriate  Insight:  Good  Engagement in Group:  Good  Additional Comments:    Gwenevere Ghazi Patience 09/27/2011, 8:54 PM

## 2011-09-27 NOTE — Progress Notes (Signed)
Pt attended discharge planning group and actively participated in group.  SW provided pt with today's workbook.  Pt presents with calm mood and affect.  Pt denies having depression, anxiety and SI today.  Pt reports feeling better today.  Pt is open to following up with [facility].  SW made referral to Charmian Muff, therapist of [facility].  Charmian Muff will assess pt today and determine if pt can start [facility].  No further needs voiced by pt at this time.    Tanya Wilson, LCSWA 09/27/2011  9:44 AM

## 2011-09-27 NOTE — Progress Notes (Signed)
D: Pt in bed resting with eyes closed. Respirations even and unlabored. Pt appears to be in no signs of distress at this time. A: Q15min checks remains for this pt. R: Pt remains safe at this time.   

## 2011-09-27 NOTE — Progress Notes (Signed)
Patient ID: Tanya Wilson, female   DOB: 28-Oct-1985, 28 y.o.   MRN: [id number] She has been up and to groups interacting with peers and staff. Depression and hopeless continues at 10, denies SI thoughts. She has not requested and prn's today.

## 2011-09-27 NOTE — Progress Notes (Signed)
[facility] Group Notes:  (Counselor/Nursing/MHT/Case Management/Adjunct)   Type of Therapy:  Group Therapy 1:15 to 2:30 Tuesday 09/26/2011   Participation Level:  Minimal  Participation Quality:  Appropriate and Attentive  Affect:  Appropriate  Cognitive:  Appropriate  Insight:  None shared  Engagement in Group:  Good  Engagement in Therapy: Attentive   Modes of Intervention:  Education, Socialization and Support  Summary of Progress/Problems:Patient attended her first group therapy session since admit which was presentation by Interior and spatial designer of  [facility] ([facility]). Onesty was quietly attentive and appropriate during session.     Clide Dales 09/27/2011 10:54 AM

## 2011-09-27 NOTE — Progress Notes (Signed)
Patient in the lounge playing cards with peers at the beginning of this shift. She appeared bright on approach. She reports a great day. Mood and affect appropriate. Pt states that she is doing very well, denies SI/HI and denies hallucinations. Pt states that she would be discharged tomorrow and she feels ready for discharge. Pt denies any problem and complaints. Excited about treatment and about going home tomorrow.

## 2011-09-27 NOTE — Progress Notes (Signed)
[facility] Group Notes:  (Counselor/Nursing/MHT/Case Management/Adjunct)   Type of Therapy:  Group Therapy 1:15 to 2:30 on Wednesday 09/27/2011   Participation Level:  Active  Participation Quality:  Attentive, Drowsy and Sharing  Affect:  Flat  Cognitive:  Alert and Oriented  Insight:  Limited  Engagement in Group:  Good  Engagement in Therapy:  Limited  Modes of Intervention:  Clarification, Orientation, Socialization and Support  Summary of Progress/Problems:The focus of this group session was to process how we deal with difficult emotions and share with others the patterns that play out when we are reacting to the emotion verses the situation.  [name] was able to identify with others who shared about difficult emotions.  She did share that guilt and shame over alcohol use often lead her to feel as though she has no right to ask for what is important to her.  [name] also shared about desire to get involved in work that truly interests her.      Clide Dales 09/28/2011 2:59 PM

## 2011-09-28 MED ORDER — ALUM & MAG HYDROXIDE-SIMETH 200-200-20 MG/5ML PO SUSP: 30.0000 mL | ORAL | Status: DC | PRN

## 2011-09-28 MED ORDER — MAGNESIUM HYDROXIDE 400 MG/5ML PO SUSP: 30.0000 mL | Freq: Every day | ORAL | Status: DC | PRN

## 2011-09-28 MED ORDER — NICOTINE 21 MG/24HR TD PT24
21.0000 mg | MEDICATED_PATCH | Freq: Every day | TRANSDERMAL | Status: DC
  Filled 2011-09-28 (×4): qty 1

## 2011-09-28 MED ORDER — ACETAMINOPHEN 325 MG PO TABS: 650.0000 mg | ORAL_TABLET | Freq: Four times a day (QID) | ORAL | Status: DC | PRN

## 2011-09-28 MED ORDER — TRAZODONE HCL 100 MG PO TABS
50.0000 mg | ORAL_TABLET | Freq: Every evening | ORAL | Status: DC | PRN
  Administered 2011-09-28: 50 mg via ORAL
  Filled 2011-09-28 (×4): qty 1

## 2011-09-28 NOTE — Tx Team (Signed)
Interdisciplinary Treatment Plan Update (Adult)  Date:  09/28/2011  Time Reviewed:  9:37 AM   Progress in Treatment: Attending groups: Yes Participating in groups:  Yes Taking medication as prescribed: Yes Tolerating medication:  Yes Family/Significant othe contact made:  No, attempts made Patient understands diagnosis:  Yes Discussing patient identified problems/goals with staff:  Yes Medical problems stabilized or resolved:  Yes Denies suicidal/homicidal ideation: Yes Issues/concerns per patient self-inventory:  None identified Other: N/A  New problem(s) identified: None Identified  Reason for Continuation of Hospitalization: Medication stabilization  Interventions implemented related to continuation of hospitalization: mood stabilization, medication monitoring and adjustment, group therapy and psycho education, safety checks q 15 mins  Additional comments: N/A  Estimated length of stay: 1 day  Discharge Plan: Pt is considering [facility] for further SA treatment.    New goal(s): N/A  Review of initial/current patient goals per problem list:    1.  Goal(s): Address substance use  Met:  No  Target date: by discharge  As evidenced by: completing detox protocol and refer to appropriate treatment  2.  Goal (s): Reduce anxiety symptoms  Met:  Yes  Target date: by discharge  As evidenced by: Reducing depression from a 10 to a 3 as reported by pt.  Pt rates at a 0 today.   Attendees: Patient:  Tanya Wilson 09/28/2011 9:38 AM   Family:     Physician:  Lupe Carney, DO 09/28/2011 9:37 AM   Nursing: Quintella Reichert, RN 09/28/2011 9:37 AM   Case Manager:  Reyes Ivan, LCSWA 09/28/2011  9:37 AM   Counselor:  Ronda Fairly, LCSWA 09/28/2011  9:37 AM   Other:  Richelle Ito, LCSW 09/28/2011 9:37 AM   Other:  Roswell Miners, RN 09/28/2011 9:38 AM   Other:     Other:      Scribe for Treatment Team:   Reyes Ivan 09/28/2011 9:37 AM

## 2011-09-28 NOTE — Progress Notes (Signed)
Patient did attend the evening karaoke group.  

## 2011-09-28 NOTE — Progress Notes (Signed)
Patient ID: Tanya Wilson, female   DOB: 1985-02-24, 26 y.o.   MRN: [id number] She has been up and to groups interacting  Peers and groups. She denies withdrawals and says that she is feeling better and wants to leave tomorrow.

## 2011-09-28 NOTE — Progress Notes (Signed)
Patient ID: Tanya Wilson, female   DOB: 01/02/1986, 26 y.o.   MRN: [id number] D: Pt is awake and active on the unit this PM. Pt denies SI/HI and A/V hallucinations. Pt is participating in the milieu and is cooperative with staff. Pt rates their depression at 1 and hopelessness at 1. Pt's most recent CIWA score was 0. Pt mood is happy and her affect is bright. Pt writes that she wants to "avoid drinking stay busy and get back to work."  A: Clinical research associate offered self, utilized therapeutic communication and administered medication per MD orders. Writer also encouraged pt to discuss feelings with staff and attend groups. Pt spoke with representative from [facility] this afternoon and she thinks it would be a good program. However, it conflicts with her work schedule.   R: Pt is attending groups and tolerating medications well. Writer will continue to monitor. 15 minute checks are ongoing for safety. Writer encouraged pt to discuss the schedule conflict with case manager for advice. Pt may also attend [facility] in [facility] which meets in the evenings after work. Pt expresses gratitude to her company for giving her the opportunity to get sober rather than let her go.

## 2011-09-28 NOTE — Progress Notes (Addendum)
    [facility] Family/Significant Other Collateral Contact  Collateral Contact: Patient's fiancee, Cassell Smiles at [phone] has been identified by the patient as the significant other who can provide for collateral information. With written consent from the patient, the family member/significant other has been contacted and reports:   That he has no additional concerns other than her alcohol use prior to treatment.   Mr Orville Govern was extremely supportive of patient discharging and receiving follow up care at [facility]   Writer provided [facility] telephone number and date of follow up appointment.  Clide Dales 09/29/2011 9:43 AM

## 2011-09-28 NOTE — Progress Notes (Signed)
Psychoeducational Group Note  Date:  09/28/2011 Time: 1100 Group Topic/Focus:  Building Self Esteem:   The Focus of this group is helping patients become aware of the effects of self-esteem on their lives, the things they and others do that enhance or undermine their self-esteem, seeing the relationship between their level of self-esteem and the choices they make and learning ways to enhance self-esteem.  Participation Level:  Active  Participation Quality:  Appropriate, Attentive and Sharing  Affect:  Appropriate  Cognitive:  Appropriate and Oriented  Insight:  Good  Engagement in Group:  Good  Additional Comments:  Pt participated in self esteem group. Pt defined self-esteem in own terms and difference between low and high self-esteem. Pt shared what were negative and positive physical, emotional, internal, and external things that increase and decrease self-esteem. Pt completed the self-esteem handout and discussed and shared answers during group. Pt shared being with family improves her self-esteem and exercising.   Tanya Wilson 09/28/2011, 1:37 PM

## 2011-09-28 NOTE — ED Provider Notes (Signed)
Medical screening examination/treatment/procedure(s) were performed by non-physician practitioner and as supervising physician I was immediately available for consultation/collaboration.   Maliq Pilley L Zamari Bonsall, MD 09/28/11 2312 

## 2011-09-28 NOTE — Progress Notes (Signed)
[facility] Group Notes:  (Counselor/Nursing/MHT/Case Management/Adjunct)   Type of Therapy:  Group Therapy at 1:15 to 2:30 PM  Participation Level:  Active  Participation Quality:  Attentive and Sharing  Affect:  Appropriate  Cognitive:  Appropriate  Insight:  Good  Engagement in Group:  Good  Engagement in Therapy:  Good  Modes of Intervention:  Activity, Clarification, Socialization and Support  Summary of Progress/Problems: Focus of group processing discussion was on balance in life; the components in life which have a negative influence on balance and the components which make for a more balanced life.  Patient shared  that self esteem is important to have a balanced life. Ephriam Knuckles also participated in activity in which patients choose photographs to represent what their life would look and feel like were it in balance (a photo of a wedding dress as apparently fiancee does not wish to set a date until patient is sober) and another for out of balance (a photo of mostly empty glasses of wine).   Clide Dales 09/28/2011 4:00 PM  ,

## 2011-09-28 NOTE — Progress Notes (Signed)
Pt attended discharge planning group and actively participated in group.  SW provided pt with today's workbook.  Pt presents with calm mood and affect.  Pt denies having depression, anxiety and SI/HI.  Pt reports feeling stable to d/c soon.  Pt was supposed to be assess by [facility] yesterday but it didn't happen.  Pt should be assessed by Charmian Muff from [facility] today.  SW will secure pt's follow up today.  Pt can return home and has transportation home.  Pt was concerned about having mood swings and if she was on a mood stabilizer.  Discussed this in treatment team.  No further needs voiced by pt at this time.  Safety planning and suicide prevention discussed.  Pt participated in discussion and acknowledged an understanding of the information provided.       Tanya Wilson, LCSWA 09/28/2011  9:43 AM

## 2011-09-28 NOTE — [facility] Suicide Risk Assessment (Signed)
Suicide Risk Assessment  Discharge Assessment      Demographic factors:  Adolescent or young adult (adult)  Current Mental Status: Patient seen and evaluated. Chart reviewed. Patient stated that her mood was "good".  Open to CD IOP, meeting with team today. Her affect was mood congruent and appropriate. She denied any current thoughts of self injurious behavior, suicidal ideation or homicidal ideation. There were no auditory or visual hallucinations, paranoia, delusional thought processes, or mania noted.  Thought process was linear and goal directed.  No psychomotor agitation or retardation was noted. Speech was normal rate, tone and volume. Eye contact was good. Judgment and insight are fair.  Patient has been up and engaged on the unit.  No acute safety concerns reported from team.  No w/d s/s noted.  Repeat manual HR in team was 60.  Pt tachycardic via BP machine.  Sleeping "ok".  Looking forward to returning to work.  Loss Factors: trouble with employer over use at work  Historical Factors:  Denied sig medical problems; no hx w/d seizures or DTs; no recent meds; BPAD during teenage yrs noted  Risk Reduction Factors:  Responsible for children under 53 years of age;Sense of responsibility to family;Employed;Living with another person, especially a relative;Positive social support;Positive therapeutic relationship; child and sig other; employment  Discharge Diagnoses: Alcohol Use Disorder; Cannabis Use Disorder, mild; Mood Disorder Unspecified; BPAD, per Hx    Cognitive Features That Contribute To Risk:  limited insight; impulsivity.  Suicide Risk: Pt viewed as a chronic increased risk of harm to self in light of her past hx and risk factors.  No acute safety concerns on the unit.  Pt contracting for safety and is stable for discharge in am to home.  Plan Of Care/Follow-up recommendations: Pt seen and evaluated. Chart reviewed.  Pt stable for and requesting discharge in am.  Will see in am in  team for final clearance/orders.  Pt contracting for safety and does not currently meet [place] involuntary commitment criteria for continued hospitalization against her will.  Mental health treatment, medication management and continued sobriety will mitigate against the potential increased risk of harm to self and/or others.  Discussed the importance of recovery further with pt, as well as, tools to move forward in a healthy & safe manner.  Pt agreeable with the plan.  Discussed with the team.  Please see orders, follow up appointments per AVS and full discharge summary. Recommend follow up with AA/NA.  Diet: Regular.  Activity: As tolerated.     Lupe Carney 09/28/2011, 3:53 PM

## 2011-09-29 MED ORDER — TRAZODONE HCL 50 MG PO TABS: 50.0000 mg | ORAL_TABLET | Freq: Every evening | ORAL | Status: DC | PRN

## 2011-09-29 MED ORDER — DIVALPROEX SODIUM ER 500 MG PO TB24: 500.0000 mg | ORAL_TABLET | Freq: Two times a day (BID) | ORAL | Status: DC

## 2011-09-29 NOTE — Progress Notes (Signed)
Pt seen and evaluated in treatment team prior to discharge this morning.  No significant change in MSE from yesterday afternoon.  Agree with SRA completed by this writer late yesterday in preparation for discharge this morning.  Pt cleared for discharge by team including nursing staff, case manager, counselor and physician.  Pt agreeable with plan.  See orders, SRA and AVS/discharge instructions.  No acute medical, psychiatric or safety issues noted at this time.  Chart reviewed. Slept "very well" last night. Returning home with fiance and son.  F/u at [facility].

## 2011-09-29 NOTE — Tx Team (Signed)
Interdisciplinary Treatment Plan Update (Adult)  Date:  09/29/2011  Time Reviewed:  10:11 AM   Progress in Treatment: Attending groups: Yes Participating in groups:  Yes Taking medication as prescribed: Yes Tolerating medication:  Yes Family/Significant othe contact made:  Yes Patient understands diagnosis:  Yes Discussing patient identified problems/goals with staff:  Yes Medical problems stabilized or resolved:  Yes Denies suicidal/homicidal ideation: Yes Issues/concerns per patient self-inventory:  None identified Other: N/A  New problem(s) identified: None Identified  Reason for Continuation of Hospitalization: Stable to d/c  Interventions implemented related to continuation of hospitalization: Stable to d/c  Additional comments: N/A  Estimated length of stay: D/C today  Discharge Plan: Pt will follow up with [facility] for substance abuse follow up and medication management.    New goal(s): N/A  Review of initial/current patient goals per problem list:    1.  Goal(s): Address substance use  Met:  Yes  Target date: by discharge  As evidenced by: completed detox protocol and referred to appropriate treatment.   Attendees: Patient:  Tanya Wilson 09/29/2011 10:11 AM   Family:     Physician:  Lupe Carney, DO 09/29/2011 10:11 AM   Nursing: Lamount Cranker, RN 09/29/2011 10:11 AM   Case Manager:  Reyes Ivan, LCSWA 09/29/2011 10:11 AM   Counselor:  Ronda Fairly, LCSWA 09/29/2011 10:11 AM   Other:  Richelle Ito, LCSW  09/29/2011 10:11 AM   Other:     Other:     Other:      Scribe for Treatment Team:   Reyes Ivan 09/29/2011 10:11 AM

## 2011-09-29 NOTE — Progress Notes (Signed)
Patient ID: Tanya Wilson, female   DOB: 06-13-1985, 26 y.o.   MRN: [id number] Pt denied SI/HI/AVH, discharge instructions given with explanations, copy of instructions/Rx/medication samples, pt's belongings returned, escorted to the lobby and her ride

## 2011-09-29 NOTE — Progress Notes (Signed)
[facility] Group Notes:  (Counselor/Nursing/MHT/Case Management/Adjunct)  09/29/2011 12:57 PM  Type of Therapy:  Psychoeducational Skills  Participation Level:  Active  Participation Quality:  Appropriate, Attentive, Sharing and Supportive  Affect:  Appropriate  Cognitive:  Alert and Appropriate  Insight:  Good  Engagement in Group:  Good  Engagement in Therapy:  Good  Modes of Intervention:  Activity, Education, Problem-solving and Socialization  Summary of Progress/Problems:Pt attended and participated in Coping Skills Pictionary where group played pictionary and then discussed the coping skills at the end of group.     Tanya Wilson 09/29/2011, 12:57 PM

## 2011-09-29 NOTE — Progress Notes (Signed)
D: Pt in bed resting with eyes closed. Respirations even and unlabored. Pt appears to be in no signs of distress at this time. A: Q15min checks remains for this pt. R: Pt remains safe at this time.   

## 2011-09-29 NOTE — Progress Notes (Signed)
[facility] Group Notes:  (Counselor/Nursing/MHT/Case Management/Adjunct)   Type of Therapy:  Group Therapy 1:15 to 2:30 PM Friday 09/29/2011   Participation Level:  Active  Participation Quality:  Appropriate, Attentive and Sharing  Affect:  Appropriate  Cognitive:  Appropriate  Insight:  Good  Engagement in Group:  Good  Engagement in Therapy:  Good  Modes of Intervention:  Clarification, Education, Socialization and Support  Summary of Progress/Problems: Group session included an educational portion on Post Acute Withdrawal Syndrome (PAWS) and a processing portion on feelings about relapse and what, if anything, is the motive for recovery.  Jolaine was very open to the educational portion and shared multiple benefits of sobriety she expects including: ability to [employer] job, ability to return to school, ability to have family support and probably some bad days but less guilt and shame and tears."   Clide Dales 09/29/2011, 6:07 PM

## 2011-09-29 NOTE — Progress Notes (Signed)
D Pt is seen [facility] on the 300 hall this morning. SHe is pleasant. Cooperative. She makes good eye contact. She denies SI. She takes her meds as ordered.   A She completed her self inventory and on it she wrote she denied SI, she rated her depression and hopelessness " 1/1" and stated her DC plan includes : stay away from alcohol and stay busy".  R Safety is in place and POC includes continuing to foster therapeutic relationship already established PD RN [facility]

## 2011-09-29 NOTE — Progress Notes (Signed)
[facility] Case Management Discharge Plan:  Will you be returning to the same living situation after discharge: Yes,  returning home At discharge, do you have transportation home?:Yes,  access to transportation Do you have the ability to pay for your medications:Yes,  access to meds  Release of information consent forms completed and in the chart;  Patient's signature needed at discharge.  Patient to Follow up at:  Follow-up Information    Follow up with [facility] on 10/05/2011. (Appointment scheduled at 11:00 am with Dr. Cheyenne Adas)    Contact information:   [address] [place], [place] [address] [phone]      Follow up with AA meetings. (See brochure for meeting times and locations!)          Patient denies SI/HI:   Yes,  denies SI/HI    Safety Planning and Suicide Prevention discussed:  Yes,  discussed with pt  Barrier to discharge identified:No.  Summary and Recommendations: Pt attended discharge planning group and actively participated in group.  SW provided pt with today's workbook.  Pt presents with calm mood and affect.  Pt denies having depression, anxiety and SI/HI.  Pt was open with following up with [facility], where she can do CDIOP in the evening, medication management and therapy.  No recommendations from SW.  No further needs voiced by pt.  Pt stable to discharge.     Carmina Miller 09/29/2011, 9:40 AM

## 2011-09-29 NOTE — Progress Notes (Signed)
Psychoeducational Group Note  Date:  09/29/2011 Time:  1100  Group Topic/Focus:  Relapse Prevention Planning:   The focus of this group is to define relapse and discuss the need for planning to combat relapse.  Participation Level:  Active  Participation Quality:  Appropriate, Attentive, Sharing and Supportive  Affect:  Appropriate and Excited  Cognitive:  Alert and Appropriate  Insight:  Good  Engagement in Group:  Good  Additional Comments:  Pt attended and participated in a group discussing relapse prevention planning. Pt was able to identify triggers and her motivation for staying clean.   Dalia Heading 09/29/2011, 1:26 PM

## 2011-10-02 NOTE — Progress Notes (Signed)
Patient Discharge Instructions:  After Visit Summary (AVS):   Faxed to:  10/02/2011 Psychiatric Admission Assessment Note:   Faxed to:  10/02/2011 Suicide Risk Assessment - Discharge Assessment:   Faxed to:  10/02/2011 Faxed/Sent to the Next Level Care provider:  10/02/2011  Faxed to [facility] @ [phone]  Wandra Scot, 10/02/2011, 1:41 PM

## 2011-10-12 NOTE — Discharge Summary (Signed)
Physician Discharge Summary Note  Patient:  Tanya Wilson is an 26 y.o., female MRN:  [id number] DOB:  1985-10-03 Patient phone:  [phone] (home)  Patient address:   [address] [place] [address]   Date of Admission:  09/26/2011 Date of Discharge: 09/29/11  Reason for Admission: see H&P.  Principal Problem:  *Alcohol abuse, continuous Active Problems:  Bipolar affective disorder, current episode hypomanic  Discharge Diagnoses: Alcohol Use Disorder; Cannabis Use Disorder, mild; Mood Disorder Unspecified; BPAD, per Hx   Level of Care:  OP  Hospital Course:  Pt admitted for crisis stabilization, detox and treatment. Pt attended all therapeutic groups, was active in her treatment planning process and agreed to her current medication regimen for further stability during recovery.  There were no acute issues during treatment and she was open to further residential substance abuse Tx.  All labs were reviewed with her in great detail and medical needs were addressed.  No acute safety issues were noted on the unit. Medications were reviewed with pt and medication education was provided. Mental health treatment, medication management and continued sobriety will mitigate against any increased risk of harm to self and/or others.  Discussed the importance of recovery with pt, as well as, tools to move forward in a healthy & safe manner using the 12 Step Process.    Consults: none.  Significant Diagnostic Studies:  See labs.  Discharge Vitals:   Blood pressure 117/82, pulse 80, temperature 96.6 F (35.9 C), temperature source Oral, resp. rate 16, height 5' 2.5" (1.588 m), weight 71.215 kg (157 lb).  Physical Findings: AIMS: Facial and Oral Movements Muscles of Facial Expression: None, normal Lips and Perioral Area: None, normal Jaw: None, normal Tongue: None, normal,Extremity Movements Upper (arms, wrists, hands, fingers): None, normal Lower (legs, knees, ankles, toes):  None, normal, Trunk Movements Neck, shoulders, hips: None, normal, Overall Severity Severity of abnormal movements (highest score from questions above): None, normal Incapacitation due to abnormal movements: None, normal Patient's awareness of abnormal movements (rate only patient's report): No Awareness, Dental Status Current problems with teeth and/or dentures?: No Does patient usually wear dentures?: No  CIWA:  CIWA-Ar Total: 0  COWS:     Mental Status Exam: See Mental Status Examination and Suicide Risk Assessment completed by Attending Physician prior to discharge.  Discharge destination:  Home  Is patient on multiple antipsychotic therapies at discharge:  No   Has Patient had three or more failed trials of antipsychotic monotherapy by history:  No  Recommended Plan for Multiple Antipsychotic Therapies: NA.     Medication List     As of 10/12/2011  3:22 PM    TAKE these medications      Indication    divalproex 500 MG 24 hr tablet   Commonly known as: DEPAKOTE ER   Take 1 tablet (500 mg total) by mouth 2 (two) times daily. For mood       traZODone 50 MG tablet   Commonly known as: DESYREL   Take 1 tablet (50 mg total) by mouth at bedtime and may repeat dose one time if needed. For sleep            Follow-up Information    Follow up with [facility] on 10/05/2011. (Appointment scheduled at 11:00 am with Dr. Cheyenne Adas)    Contact information:   [address] [place], [place] [address] [phone]      Follow up with AA meetings. (See brochure for meeting times and  locations!)         Plan Of Care/Follow-up recommendations: Pt seen and evaluated. Chart reviewed. Pt stable for and requesting discharge in am. Will see in am in team for final clearance/orders. Pt contracting for safety and does not currently meet [place] involuntary commitment criteria for continued hospitalization against her will. Mental health treatment, medication management and continued sobriety  will mitigate against the potential increased risk of harm to self and/or others. Discussed the importance of recovery further with pt, as well as, tools to move forward in a healthy & safe manner. Pt agreeable with the plan.  Recommend follow up with AA/NA. Diet: Regular. Activity: As tolerated.    Signed: Lupe Carney 10/12/2011, 3:22 PM

## 2011-11-09 ENCOUNTER — Emergency Department (HOSPITAL_BASED_OUTPATIENT_CLINIC_OR_DEPARTMENT_OTHER): Payer: BC Managed Care – PPO

## 2011-11-09 ENCOUNTER — Emergency Department (HOSPITAL_BASED_OUTPATIENT_CLINIC_OR_DEPARTMENT_OTHER)
Admission: EM | Admit: 2011-11-09 | Discharge: 2011-11-09 | Disposition: A | Discharge status: Home / Self Care | Payer: BC Managed Care – PPO | Attending: Emergency Medicine | Admitting: Emergency Medicine

## 2011-11-09 ENCOUNTER — Encounter (HOSPITAL_BASED_OUTPATIENT_CLINIC_OR_DEPARTMENT_OTHER): Payer: Self-pay | Admitting: *Deleted

## 2011-11-09 DIAGNOSIS — F17200 Nicotine dependence, unspecified, uncomplicated: Secondary | ICD-10-CM | POA: Insufficient documentation

## 2011-11-09 DIAGNOSIS — R071 Chest pain on breathing: Secondary | ICD-10-CM | POA: Insufficient documentation

## 2011-11-09 DIAGNOSIS — K219 Gastro-esophageal reflux disease without esophagitis: Secondary | ICD-10-CM | POA: Insufficient documentation

## 2011-11-09 DIAGNOSIS — R0789 Other chest pain: Secondary | ICD-10-CM

## 2011-11-09 DIAGNOSIS — F411 Generalized anxiety disorder: Secondary | ICD-10-CM | POA: Insufficient documentation

## 2011-11-09 DIAGNOSIS — F319 Bipolar disorder, unspecified: Secondary | ICD-10-CM | POA: Insufficient documentation

## 2011-11-09 HISTORY — DX: Anxiety disorder, unspecified: F41.9

## 2011-11-09 HISTORY — DX: Bipolar disorder, unspecified: F31.9

## 2011-11-09 HISTORY — DX: Gastro-esophageal reflux disease without esophagitis: K21.9

## 2011-11-09 HISTORY — DX: Reserved for inherently not codable concepts without codable children: IMO0001

## 2011-11-09 LAB — BASIC METABOLIC PANEL
CO2: 21 mEq/L (ref 19–32)
Glucose, Bld: 96 mg/dL (ref 70–99)
Potassium: 3.2 mEq/L — ABNORMAL LOW (ref 3.5–5.1)
Sodium: 134 mEq/L — ABNORMAL LOW (ref 135–145)

## 2011-11-09 LAB — CBC WITH DIFFERENTIAL/PLATELET
Hemoglobin: 13.1 g/dL (ref 12.0–15.0)
Lymphocytes Relative: 15 % (ref 12–46)
Lymphs Abs: 0.8 10*3/uL (ref 0.7–4.0)
MCV: 98.2 fL (ref 78.0–100.0)
Neutrophils Relative %: 74 % (ref 43–77)
Platelets: 295 10*3/uL (ref 150–400)
RBC: 3.81 MIL/uL — ABNORMAL LOW (ref 3.87–5.11)
WBC: 5.5 10*3/uL (ref 4.0–10.5)

## 2011-11-09 LAB — D-DIMER, QUANTITATIVE: D-Dimer, Quant: <0.27 ug/mL-FEU (ref 0.00–0.48)

## 2011-11-09 MED ORDER — GI COCKTAIL ~~LOC~~
ORAL | Status: AC
  Filled 2011-11-09: qty 30

## 2011-11-09 MED ORDER — KETOROLAC TROMETHAMINE 30 MG/ML IJ SOLN
15.0000 mg | Freq: Once | INTRAMUSCULAR | Status: AC
  Administered 2011-11-09: 15 mg via INTRAVENOUS

## 2011-11-09 MED ORDER — KETOROLAC TROMETHAMINE 30 MG/ML IJ SOLN
INTRAMUSCULAR | Status: AC
  Filled 2011-11-09: qty 1

## 2011-11-09 MED ORDER — GI COCKTAIL ~~LOC~~
30.0000 mL | Freq: Once | ORAL | Status: AC
  Administered 2011-11-09: 30 mL via ORAL

## 2011-11-09 NOTE — ED Provider Notes (Signed)
History     CSN: [id number]  Arrival date & time 11/09/11  4098   First MD Initiated Contact with Patient 11/09/11 0957      Chief Complaint  Patient presents with  . Chest Pain    (Consider location/radiation/quality/duration/timing/severity/associated sxs/prior treatment) HPIChristina Wilson is a 26 y.o. female with a history of alcohol abuse, anxiety and bipolar affective disorder who was recently started on Seroquel. She was taking 50 mg of Seroquel and this dosage was increased to 300 mg over 4 days. This started last Monday. Symptoms started on Monday about 4 hours after taking her medication.  Her chest pain began as left-sided chest pain, intermittent, worse at night, initially it was a pressure, moderate, nonradiating. Currently, the pain is left-sided chest pain described as pressure, it is currently a 7/10, nonradiating, not associated with nausea, shortness of breath or diaphoresis.  She notes that there is a pleuritic component to the chest pain. She has no history of having blood clots, no family history of heart disease in adults younger than 66. History of GERD also. Denies illegal drug abuse.  ROS Past Medical History  Diagnosis Date  . ETOH abuse   . Anxiety   . Bipolar affective   . Reflux     History reviewed. No pertinent past surgical history.  No family history on file.  History  Substance Use Topics  . Smoking status: Current Some Day Smoker    Types: Cigarettes  . Smokeless tobacco: Not on file  . Alcohol Use: 1.8 oz/week    3 Cans of beer per week     sometimes 3-4  16oz malts q-day for 3 wks.    OB History    Grav Para Term Preterm Abortions TAB SAB Ect Mult Living                  Review of Systems At least 10pt or greater review of systems completed and are negative except where specified in the HPI.  Allergies  Penicillins  Home Medications   Current Outpatient Rx  Name Route Sig Dispense Refill  . CLONAZEPAM 1 MG PO TABS Oral  Take 1 mg by mouth 2 (two) times daily as needed.    Marland Kitchen QUETIAPINE FUMARATE 300 MG PO TABS Oral Take 300 mg by mouth at bedtime.    Marland Kitchen DIVALPROEX SODIUM ER 500 MG PO TB24 Oral Take 1 tablet (500 mg total) by mouth 2 (two) times daily. For mood 60 tablet 0  . TRAZODONE HCL 50 MG PO TABS Oral Take 1 tablet (50 mg total) by mouth at bedtime and may repeat dose one time if needed. For sleep 60 tablet 0    BP 138/88  Pulse 84  Temp 98.4 F (36.9 C) (Oral)  Resp 22  Ht 5\' 4"  (1.626 m)  Wt 151 lb (68.493 kg)  BMI 25.92 kg/m2  SpO2 100%  Physical Exam  Nursing notes reviewed.  Electronic medical record reviewed. VITAL SIGNS:   Filed Vitals:   11/09/11 0947  BP: 138/88  Pulse: 84  Temp: 98.4 F (36.9 C)  TempSrc: Oral  Resp: 22  Height: 5\' 4"  (1.626 m)  Weight: 151 lb (68.493 kg)  SpO2: 100%   CONSTITUTIONAL: Awake, oriented, appears non-toxic HENT: Atraumatic, normocephalic, oral mucosa pink and moist, airway patent. Nares patent without drainage. External ears normal. EYES: Conjunctiva clear, EOMI, PERRLA NECK: Trachea midline, non-tender, supple CARDIOVASCULAR: Normal heart rate, Normal rhythm, No murmurs, rubs, gallops PULMONARY/CHEST: Clear to auscultation, no rhonchi,  wheezes, or rales. Symmetrical breath sounds. Reproducible left chest wall pain from the third interspace left of the sternum down to the fifth interspace. ABDOMINAL: Non-distended, soft, non-tender - no rebound or guarding.  BS normal. NEUROLOGIC: Non-focal, moving all four extremities, no gross sensory or motor deficits. EXTREMITIES: No clubbing, cyanosis, or edema SKIN: Warm, Dry, No erythema, No rash  ED Course  Procedures (including critical care time)  Date: 11/09/2011  Rate: 61  Rhythm: sinus arrhythmia  QRS Axis: normal  Intervals: normal  ST/T Wave abnormalities: normal  Conduction Disutrbances: none  Narrative Interpretation: unremarkable - no ST or T wave abnormalities consistent with ischemia  or infarction. No malignant arrhythmia  Labs Reviewed  CBC WITH DIFFERENTIAL - Abnormal; Notable for the following:    RBC 3.81 (*)     MCH 34.4 (*)     All other components within normal limits  BASIC METABOLIC PANEL - Abnormal; Notable for the following:    Sodium 134 (*)     Potassium 3.2 (*)     Chloride 95 (*)     All other components within normal limits  TROPONIN I  D-DIMER, QUANTITATIVE   Dg Chest 2 View  11/09/2011  *RADIOLOGY REPORT*  Clinical Data: Chest pain.  CHEST - 2 VIEW  Comparison: None  Findings: Heart and mediastinal contours are within normal limits. No focal opacities or effusions.  No acute bony abnormality.  IMPRESSION: No active cardiopulmonary disease.   Original Report Authenticated By: Cyndie Chime, M.D.    1. Chest wall pain   2. Reflux       MDM  Tanya Wilson is a 26 y.o. female presenting with atypical chest pain-this started after increasing the dose of her cervical from 50 mg to 300 mg. Patient does have a history of GERD. Patient's presentation is not consistent with ACS or any other severe intrathoracic abnormality.  Initial impression is that of functional dyspepsia versus GERD symptoms, versus anxiety. Also consider in the differential ACS, pulmonary embolism-although my pretest probability is very low, I do not suspect pneumothorax, pericarditis, transected the esophagus, mediastinitis, myocarditis, pneumonia.  Labs are unremarkable. Potassium was marginally low at 3.2. EKG is within normal limits showing a sinus arrhythmia, normal QT interval, no malignant arrhythmia or ST or T wave abnormalities. D-dimer and troponins are negative. Patient discharged home stable and good condition with pain medicine (ibuprofen as well as instructions to treat GERD symptoms.)  I explained the diagnosis and have given explicit precautions to return to the ER including worsening pain, change in pain, shortness of breath or any other new or worsening symptoms.  The patient understands and accepts the medical plan as it's been dictated and I have answered their questions. Discharge instructions concerning home care and prescriptions have been given.  The patient is STABLE and is discharged to home in good condition.         Jones Skene, MD 11/10/11 [phone]

## 2011-11-09 NOTE — ED Notes (Addendum)
Patient states she developed left chest pain 3 days ago.  States she was recently started seroquel one week ago for bipolar.  States 3 days ago she was to increase the dose.  States approximately 3.5 hours after taking the seroquel, she developed left chest pain and pressure.  States the pain is constant and increasing in intensity and has been associated with nausea and diaphorisis.  States she has not taken any Seroquel since Monday.

## 2011-11-09 NOTE — ED Notes (Signed)
MD at bedside. 

## 2011-11-09 NOTE — ED Notes (Signed)
Pt appears anxious.

## 2011-11-09 NOTE — ED Notes (Signed)
Patient transported to X-ray via strtecher 

## 2011-11-09 NOTE — ED Notes (Signed)
Patient transported to X-ray 

## 2016-03-14 ENCOUNTER — Emergency Department (HOSPITAL_BASED_OUTPATIENT_CLINIC_OR_DEPARTMENT_OTHER)
Admission: EM | Admit: 2016-03-14 | Discharge: 2016-03-14 | Disposition: A | Discharge status: Home / Self Care | Payer: Commercial Managed Care - PPO | Attending: Emergency Medicine | Admitting: Emergency Medicine

## 2016-03-14 ENCOUNTER — Emergency Department (HOSPITAL_BASED_OUTPATIENT_CLINIC_OR_DEPARTMENT_OTHER): Payer: Commercial Managed Care - PPO

## 2016-03-14 ENCOUNTER — Encounter (HOSPITAL_BASED_OUTPATIENT_CLINIC_OR_DEPARTMENT_OTHER): Payer: Self-pay | Admitting: *Deleted

## 2016-03-14 DIAGNOSIS — Y999 Unspecified external cause status: Secondary | ICD-10-CM | POA: Insufficient documentation

## 2016-03-14 DIAGNOSIS — W108XXA Fall (on) (from) other stairs and steps, initial encounter: Secondary | ICD-10-CM | POA: Diagnosis not present

## 2016-03-14 DIAGNOSIS — S0990XA Unspecified injury of head, initial encounter: Secondary | ICD-10-CM

## 2016-03-14 DIAGNOSIS — Y939 Activity, unspecified: Secondary | ICD-10-CM | POA: Diagnosis not present

## 2016-03-14 DIAGNOSIS — F17210 Nicotine dependence, cigarettes, uncomplicated: Secondary | ICD-10-CM | POA: Diagnosis not present

## 2016-03-14 DIAGNOSIS — Y929 Unspecified place or not applicable: Secondary | ICD-10-CM | POA: Diagnosis not present

## 2016-03-14 DIAGNOSIS — F1290 Cannabis use, unspecified, uncomplicated: Secondary | ICD-10-CM | POA: Insufficient documentation

## 2016-03-14 NOTE — ED Provider Notes (Signed)
[facility]   CSN: [id number] Arrival date & time: 03/14/16  1258     History   Chief Complaint Chief Complaint  Patient presents with  . Fall    HPI Tanya Wilson is a 31 y.o. female.  HPI Pt feel down the stairs on Sunday.   She hit the back of her head.  SInce then she has had a non stop headache that wont go away.  She did have a brief LOC after the fall.   SHe has tried ibuprofen without relief.  Her vision still feels blurred at times.    SHe told this to her nurse at work and was told to come to the ED to get a ct scan.  She does have some soreness elsewhere but those sx are mild.   Past Medical History:  Diagnosis Date  . Anxiety   . Bipolar affective (HCC)   . ETOH abuse   . Reflux     Patient Active Problem List   Diagnosis Date Noted  . Alcohol abuse, continuous 09/26/2011    Class: Acute  . Bipolar affective disorder, current episode hypomanic (HCC) 09/26/2011    Class: Chronic    History reviewed. No pertinent surgical history.  OB History    No data available       Home Medications    Prior to Admission medications   Not on File    Family History History reviewed. No pertinent family history.  Social History Social History  Substance Use Topics  . Smoking status: Current Some Day Smoker    Types: Cigarettes  . Smokeless tobacco: Never Used  . Alcohol use 1.8 oz/week    3 Cans of beer per week     Comment: sometimes 3-4  16oz malts q-day for 3 wks.     Allergies   Penicillins   Review of Systems Review of Systems  All other systems reviewed and are negative.    Physical Exam Updated Vital Signs BP 141/100 (BP Location: Right Arm)   Pulse 97   Temp 98.2 F (36.8 C) (Oral)   Resp 20   Ht 5\' 4"  (1.626 m)   Wt 72.6 kg   SpO2 98%   BMI 27.46 kg/m   Physical Exam  Constitutional: She appears well-developed and well-nourished. No distress.  HENT:  Head: Normocephalic and atraumatic.  Right  Ear: External ear normal.  Left Ear: External ear normal.  Eyes: Conjunctivae are normal. Right eye exhibits no discharge. Left eye exhibits no discharge. No scleral icterus.  Neck: Neck supple. Spinous process tenderness present. No tracheal deviation present.  Cardiovascular: Normal rate, regular rhythm and intact distal pulses.   Pulmonary/Chest: Effort normal and breath sounds normal. No stridor. No respiratory distress. She has no wheezes. She has no rales.  Abdominal: Soft. Bowel sounds are normal. She exhibits no distension. There is no tenderness. There is no rebound and no guarding.  Musculoskeletal: She exhibits no edema or tenderness.       Right shoulder: She exhibits no tenderness, no bony tenderness and no swelling.       Left shoulder: She exhibits no tenderness, no bony tenderness and no swelling.       Right wrist: She exhibits no tenderness, no bony tenderness and no swelling.       Left wrist: She exhibits no tenderness, no bony tenderness and no swelling.       Right hip: She exhibits normal range of motion, no tenderness, no bony  tenderness and no swelling.       Left hip: She exhibits normal range of motion, no tenderness and no bony tenderness.       Right ankle: She exhibits no swelling. No tenderness.       Left ankle: She exhibits no swelling. No tenderness.       Cervical back: She exhibits bony tenderness. She exhibits no tenderness and no swelling.       Thoracic back: She exhibits no tenderness, no bony tenderness and no swelling.       Lumbar back: She exhibits no tenderness, no bony tenderness and no swelling.  Neurological: She is alert. She has normal strength. No cranial nerve deficit (no facial droop, extraocular movements intact, no slurred speech) or sensory deficit. She exhibits normal muscle tone. She displays no seizure activity. Coordination normal.  Skin: Skin is warm and dry. No rash noted.  Psychiatric: She has a normal mood and affect.  Nursing note  and vitals reviewed.    ED Treatments / Results  Labs (all labs ordered are listed, but only abnormal results are displayed) Labs Reviewed - No data to display  EKG  EKG Interpretation None       Radiology Ct Head Wo Contrast  Result Date: 03/14/2016 CLINICAL DATA:  Fall down steps on Sunday, +LOC, pt states that she hit the back of her head, neck pain, blurred vision, dizzy, tired EXAM: CT HEAD WITHOUT CONTRAST CT CERVICAL SPINE WITHOUT CONTRAST TECHNIQUE: Multidetector CT imaging of the head and cervical spine was performed following the standard protocol without intravenous contrast. Multiplanar CT image reconstructions of the cervical spine were also generated. COMPARISON:  None. FINDINGS: CT HEAD FINDINGS Brain: No evidence of acute infarction, hemorrhage, hydrocephalus, extra-axial collection or mass lesion/mass effect. Vascular: No hyperdense vessel or unexpected calcification. Skull: Normal. Negative for fracture or focal lesion. Sinuses/Orbits: Visualized globes orbits are unremarkable. Visualized sinuses and mastoid air cells are clear. Other: None. CT CERVICAL SPINE FINDINGS Alignment: Normal. Skull base and vertebrae: No acute fracture. No primary bone lesion or focal pathologic process. Soft tissues and spinal canal: No prevertebral fluid or swelling. No visible canal hematoma. Disc levels: No disc bulging or disc herniation. No degenerative change. The central spinal canal and neural foramina are well preserved. Upper chest: Negative. Other: None. IMPRESSION: HEAD CT:  No intracranial abnormality.  No skull fracture. CERVICAL CT:  Normal. Electronically Signed   By: Amie Portland M.D.   On: 03/14/2016 16:28   Ct Cervical Spine Wo Contrast  Result Date: 03/14/2016 CLINICAL DATA:  Fall down steps on Sunday, +LOC, pt states that she hit the back of her head, neck pain, blurred vision, dizzy, tired EXAM: CT HEAD WITHOUT CONTRAST CT CERVICAL SPINE WITHOUT CONTRAST TECHNIQUE:  Multidetector CT imaging of the head and cervical spine was performed following the standard protocol without intravenous contrast. Multiplanar CT image reconstructions of the cervical spine were also generated. COMPARISON:  None. FINDINGS: CT HEAD FINDINGS Brain: No evidence of acute infarction, hemorrhage, hydrocephalus, extra-axial collection or mass lesion/mass effect. Vascular: No hyperdense vessel or unexpected calcification. Skull: Normal. Negative for fracture or focal lesion. Sinuses/Orbits: Visualized globes orbits are unremarkable. Visualized sinuses and mastoid air cells are clear. Other: None. CT CERVICAL SPINE FINDINGS Alignment: Normal. Skull base and vertebrae: No acute fracture. No primary bone lesion or focal pathologic process. Soft tissues and spinal canal: No prevertebral fluid or swelling. No visible canal hematoma. Disc levels: No disc bulging or disc herniation. No degenerative change.  The central spinal canal and neural foramina are well preserved. Upper chest: Negative. Other: None. IMPRESSION: HEAD CT:  No intracranial abnormality.  No skull fracture. CERVICAL CT:  Normal. Electronically Signed   By: Amie Portland M.D.   On: 03/14/2016 16:28    Procedures Procedures (including critical care time)  Medications Ordered in ED Medications - No data to display   Initial Impression / Assessment and Plan / ED Course  I have reviewed the triage vital signs and the nursing notes.  Pertinent labs & imaging results that were available during my care of the patient were reviewed by me and considered in my medical decision making (see chart for details).    No evidence of serious injury associated with the fall.  Consistent with soft tissue injury/strain.  Explained findings to patient and warning signs that should prompt return to the ED.  Final Clinical Impressions(s) / ED Diagnoses   Final diagnoses:  Injury of head, initial encounter    New Prescriptions New Prescriptions     No medications on file     Linwood Dibbles, MD 03/14/16 1712

## 2016-03-14 NOTE — ED Triage Notes (Signed)
Pt reports slip and fall down carpeted stairs onto hard tile floor, denies loc, states she has had headache since then, and could not go to work yesterday.

## 2016-03-14 NOTE — Discharge Instructions (Signed)
Take tylenol or advil as needed for pain

## 2016-03-14 NOTE — ED Notes (Signed)
ED Provider at bedside. 

## 2017-07-17 ENCOUNTER — Inpatient Hospital Stay (HOSPITAL_BASED_OUTPATIENT_CLINIC_OR_DEPARTMENT_OTHER)
Admission: EM | Admit: 2017-07-17 | Discharge: 2017-07-20 | DRG: 440 | Disposition: A | Discharge status: Home / Self Care | Payer: Commercial Managed Care - PPO | Attending: Family Medicine | Admitting: Family Medicine

## 2017-07-17 ENCOUNTER — Inpatient Hospital Stay (HOSPITAL_COMMUNITY): Payer: Commercial Managed Care - PPO

## 2017-07-17 ENCOUNTER — Encounter (HOSPITAL_BASED_OUTPATIENT_CLINIC_OR_DEPARTMENT_OTHER): Payer: Self-pay | Admitting: Emergency Medicine

## 2017-07-17 ENCOUNTER — Other Ambulatory Visit: Payer: Self-pay

## 2017-07-17 DIAGNOSIS — R748 Abnormal levels of other serum enzymes: Secondary | ICD-10-CM

## 2017-07-17 DIAGNOSIS — K852 Alcohol induced acute pancreatitis without necrosis or infection: Secondary | ICD-10-CM | POA: Diagnosis present

## 2017-07-17 DIAGNOSIS — F17210 Nicotine dependence, cigarettes, uncomplicated: Secondary | ICD-10-CM | POA: Diagnosis present

## 2017-07-17 DIAGNOSIS — Z793 Long term (current) use of hormonal contraceptives: Secondary | ICD-10-CM

## 2017-07-17 DIAGNOSIS — K219 Gastro-esophageal reflux disease without esophagitis: Secondary | ICD-10-CM | POA: Diagnosis present

## 2017-07-17 DIAGNOSIS — K859 Acute pancreatitis without necrosis or infection, unspecified: Secondary | ICD-10-CM | POA: Diagnosis present

## 2017-07-17 DIAGNOSIS — R809 Proteinuria, unspecified: Secondary | ICD-10-CM | POA: Diagnosis present

## 2017-07-17 DIAGNOSIS — R21 Rash and other nonspecific skin eruption: Secondary | ICD-10-CM | POA: Diagnosis not present

## 2017-07-17 DIAGNOSIS — F1010 Alcohol abuse, uncomplicated: Secondary | ICD-10-CM | POA: Diagnosis present

## 2017-07-17 DIAGNOSIS — L719 Rosacea, unspecified: Secondary | ICD-10-CM | POA: Diagnosis present

## 2017-07-17 DIAGNOSIS — K7010 Alcoholic hepatitis without ascites: Secondary | ICD-10-CM | POA: Diagnosis present

## 2017-07-17 DIAGNOSIS — R140 Abdominal distension (gaseous): Secondary | ICD-10-CM

## 2017-07-17 LAB — URINALYSIS, ROUTINE W REFLEX MICROSCOPIC
BILIRUBIN URINE: NEGATIVE
GLUCOSE, UA: 150 mg/dL — AB
KETONES UR: 80 mg/dL — AB
LEUKOCYTES UA: NEGATIVE
NITRITE: NEGATIVE
PROTEIN: 30 mg/dL — AB
Specific Gravity, Urine: 1.019 (ref 1.005–1.030)
pH: 5 (ref 5.0–8.0)

## 2017-07-17 LAB — COMPREHENSIVE METABOLIC PANEL
ALBUMIN: 4.3 g/dL (ref 3.5–5.0)
ALK PHOS: 156 U/L — AB (ref 38–126)
ALT: 89 U/L — ABNORMAL HIGH (ref 0–44)
ANION GAP: 21 — AB (ref 5–15)
AST: 115 U/L — ABNORMAL HIGH (ref 15–41)
BILIRUBIN TOTAL: 1.9 mg/dL — AB (ref 0.3–1.2)
BUN: 9 mg/dL (ref 6–20)
CALCIUM: 9.1 mg/dL (ref 8.9–10.3)
CO2: 13 mmol/L — ABNORMAL LOW (ref 22–32)
Chloride: 98 mmol/L (ref 98–111)
Creatinine, Ser: 0.89 mg/dL (ref 0.44–1.00)
GLUCOSE: 161 mg/dL — AB (ref 70–99)
POTASSIUM: 3.9 mmol/L (ref 3.5–5.1)
Sodium: 132 mmol/L — ABNORMAL LOW (ref 135–145)
TOTAL PROTEIN: 8.5 g/dL — AB (ref 6.5–8.1)

## 2017-07-17 LAB — LIPID PANEL
CHOL/HDL RATIO: 2.6 ratio
CHOLESTEROL: 257 mg/dL — AB (ref 0–200)
HDL: 98 mg/dL (ref >40)
LDL Cholesterol: 135 mg/dL — ABNORMAL HIGH (ref 0–99)
Triglycerides: 120 mg/dL (ref <150)
VLDL: 24 mg/dL (ref 0–40)

## 2017-07-17 LAB — CBC
HCT: 43.5 % (ref 36.0–46.0)
Hemoglobin: 15.2 g/dL — ABNORMAL HIGH (ref 12.0–15.0)
MCH: 35.9 pg — AB (ref 26.0–34.0)
MCHC: 34.9 g/dL (ref 30.0–36.0)
MCV: 102.8 fL — ABNORMAL HIGH (ref 78.0–100.0)
Platelets: 371 10*3/uL (ref 150–400)
RBC: 4.23 MIL/uL (ref 3.87–5.11)
RDW: 12.8 % (ref 11.5–15.5)
WBC: 10.6 10*3/uL — ABNORMAL HIGH (ref 4.0–10.5)

## 2017-07-17 LAB — LIPASE, BLOOD: LIPASE: 1795 U/L — AB (ref 11–51)

## 2017-07-17 LAB — PREGNANCY, URINE: Preg Test, Ur: NEGATIVE

## 2017-07-17 MED ORDER — FOLIC ACID 1 MG PO TABS
1.0000 mg | ORAL_TABLET | Freq: Every day | ORAL | Status: DC
  Administered 2017-07-17 – 2017-07-20 (×4): 1 mg via ORAL
  Filled 2017-07-17 (×4): qty 1

## 2017-07-17 MED ORDER — LORAZEPAM 2 MG/ML IJ SOLN: 1.0000 mg | Freq: Four times a day (QID) | INTRAMUSCULAR | Status: AC | PRN

## 2017-07-17 MED ORDER — ONDANSETRON HCL 4 MG/2ML IJ SOLN
4.0000 mg | Freq: Once | INTRAMUSCULAR | Status: AC
  Administered 2017-07-17: 4 mg via INTRAVENOUS
  Filled 2017-07-17: qty 2

## 2017-07-17 MED ORDER — SODIUM CHLORIDE 0.9 % IV BOLUS
1000.0000 mL | Freq: Once | INTRAVENOUS | Status: AC
  Administered 2017-07-17: 1000 mL via INTRAVENOUS

## 2017-07-17 MED ORDER — ONDANSETRON HCL 4 MG PO TABS: 4.0000 mg | ORAL_TABLET | Freq: Four times a day (QID) | ORAL | Status: DC | PRN

## 2017-07-17 MED ORDER — ENOXAPARIN SODIUM 40 MG/0.4ML ~~LOC~~ SOLN
40.0000 mg | SUBCUTANEOUS | Status: DC
  Administered 2017-07-17 – 2017-07-19 (×3): 40 mg via SUBCUTANEOUS
  Filled 2017-07-17 (×3): qty 0.4

## 2017-07-17 MED ORDER — ADULT MULTIVITAMIN W/MINERALS CH
1.0000 | ORAL_TABLET | Freq: Every day | ORAL | Status: DC
  Administered 2017-07-17 – 2017-07-20 (×4): 1 via ORAL
  Filled 2017-07-17 (×4): qty 1

## 2017-07-17 MED ORDER — SODIUM CHLORIDE 0.9 % IV SOLN
INTRAVENOUS | Status: DC
  Administered 2017-07-17 – 2017-07-20 (×6): via INTRAVENOUS

## 2017-07-17 MED ORDER — MORPHINE SULFATE (PF) 4 MG/ML IV SOLN
4.0000 mg | Freq: Once | INTRAVENOUS | Status: AC
  Administered 2017-07-17: 4 mg via INTRAVENOUS
  Filled 2017-07-17: qty 1

## 2017-07-17 MED ORDER — LORAZEPAM 1 MG PO TABS: 1.0000 mg | ORAL_TABLET | Freq: Four times a day (QID) | ORAL | Status: AC | PRN

## 2017-07-17 MED ORDER — ACETAMINOPHEN 650 MG RE SUPP: 650.0000 mg | Freq: Four times a day (QID) | RECTAL | Status: DC | PRN

## 2017-07-17 MED ORDER — MORPHINE SULFATE (PF) 2 MG/ML IV SOLN
2.0000 mg | INTRAVENOUS | Status: DC | PRN
  Administered 2017-07-17 – 2017-07-18 (×10): 2 mg via INTRAVENOUS
  Filled 2017-07-17 (×10): qty 1

## 2017-07-17 MED ORDER — THIAMINE HCL 100 MG/ML IJ SOLN
100.0000 mg | Freq: Every day | INTRAMUSCULAR | Status: DC
  Filled 2017-07-17 (×4): qty 1

## 2017-07-17 MED ORDER — ONDANSETRON HCL 4 MG/2ML IJ SOLN
4.0000 mg | Freq: Four times a day (QID) | INTRAMUSCULAR | Status: DC | PRN
  Administered 2017-07-17 (×2): 4 mg via INTRAVENOUS
  Filled 2017-07-17 (×3): qty 2

## 2017-07-17 MED ORDER — ACETAMINOPHEN 325 MG PO TABS
650.0000 mg | ORAL_TABLET | Freq: Four times a day (QID) | ORAL | Status: DC | PRN
  Administered 2017-07-17 – 2017-07-20 (×7): 650 mg via ORAL
  Filled 2017-07-17 (×7): qty 2

## 2017-07-17 MED ORDER — VITAMIN B-1 100 MG PO TABS
100.0000 mg | ORAL_TABLET | Freq: Every day | ORAL | Status: DC
  Administered 2017-07-17 – 2017-07-20 (×4): 100 mg via ORAL
  Filled 2017-07-17 (×4): qty 1

## 2017-07-17 NOTE — Plan of Care (Signed)
32 yo F with intractable vomiting due to acute pancreatitis.  Seems to be EtOH induced with heavy drinking history.  Didn't get imaging in ED cause no US.  Have requested pregnancy test as well.  Will put in med-surg.  US pending.  Please call flow manager when patient arrives.

## 2017-07-17 NOTE — ED Notes (Signed)
ED TO INPATIENT HANDOFF REPORT  Name/Age/Gender Tanya Wilson 32 y.o. female  Code Status   Home/SNF/Other Home  Chief Complaint vomiting; abdominal pain; back pain  Level of Care/Admitting Diagnosis ED Disposition    ED Disposition Condition [facility] [place] [[id number]]  Level of Care: Med-Surg [16]  Diagnosis: Acute pancreatitis [577.0.ICD-9-CM]  Admitting Physician: Etta Quill [[id number]]  Attending Physician: Etta Quill [[id number]]  Estimated length of stay: past midnight tomorrow  Certification:: I certify this patient will need inpatient services for at least 2 midnights  PT Class (Do Not Modify): Inpatient [101]  PT Acc Code (Do Not Modify): Private [1]       Medical History Past Medical History:  Diagnosis Date  . Anxiety   . Bipolar affective ([place])   . ETOH abuse   . Reflux     Allergies Allergies  Allergen Reactions  . Penicillins Hives    IV Location/Drains/Wounds Patient Lines/Drains/Airways Status   Active Line/Drains/Airways    Name:   Placement date:   Placement time:   Site:   Days:   Peripheral IV 07/17/17 Right Antecubital   07/17/17    0325    Antecubital   less than 1          Labs/Imaging Results for orders placed or performed during the hospital encounter of 07/17/17 (from the past 48 hour(s))  CBC     Status: Abnormal   Collection Time: 07/17/17  3:25 AM  Result Value Ref Range   WBC 10.6 (H) 4.0 - 10.5 K/uL   RBC 4.23 3.87 - 5.11 MIL/uL   Hemoglobin 15.2 (H) 12.0 - 15.0 g/dL   HCT 43.5 36.0 - 46.0 %   MCV 102.8 (H) 78.0 - 100.0 fL   MCH 35.9 (H) 26.0 - 34.0 pg   MCHC 34.9 30.0 - 36.0 g/dL   RDW 12.8 11.5 - 15.5 %   Platelets 371 150 - 400 K/uL    Comment: Performed at [facility], [address] [place], [place] [address]  Comprehensive metabolic panel     Status: Abnormal   Collection Time: 07/17/17  3:25 AM  Result Value Ref Range   Sodium 132 (L) 135 -  145 mmol/L   Potassium 3.9 3.5 - 5.1 mmol/L   Chloride 98 98 - 111 mmol/L   CO2 13 (L) 22 - 32 mmol/L   Glucose, Bld 161 (H) 70 - 99 mg/dL   BUN 9 6 - 20 mg/dL   Creatinine, Ser 0.89 0.44 - 1.00 mg/dL   Calcium 9.1 8.9 - 10.3 mg/dL   Total Protein 8.5 (H) 6.5 - 8.1 g/dL   Albumin 4.3 3.5 - 5.0 g/dL   AST 115 (H) 15 - 41 U/L   ALT 89 (H) 0 - 44 U/L   Alkaline Phosphatase 156 (H) 38 - 126 U/L   Total Bilirubin 1.9 (H) 0.3 - 1.2 mg/dL   GFR calc non Af Amer >60 >60 mL/min   GFR calc Af Amer >60 >60 mL/min    Comment: (NOTE) The eGFR has been calculated using the CKD EPI equation. This calculation has not been validated in all clinical situations. eGFR's persistently <60 mL/min signify possible Chronic Kidney Disease.    Anion gap 21 (H) 5 - 15    Comment: Performed at [facility], [address] [place], [place] [address]  Lipase, blood     Status: Abnormal   Collection Time:  07/17/17  3:25 AM  Result Value Ref Range   Lipase 1,795 (H) 11 - 51 U/L    Comment: RESULTS CONFIRMED BY MANUAL DILUTION Performed at [facility], [address] [place], [place] [address]    No results found.  Pending Labs Unresulted Labs (From admission, onward)   Start     Ordered   07/17/17 0320  Pregnancy, urine  STAT,   STAT     07/17/17 0319   07/17/17 0319  Urinalysis, Routine w reflex microscopic  Once,   R     07/17/17 0319      Vitals/Pain Today's Vitals   07/17/17 0305 07/17/17 0315 07/17/17 0358 07/17/17 0427  BP: (!) 140/103     Pulse: 90     Resp: 20     Temp: 98 F (36.7 C)     TempSrc: Oral     SpO2: 99%     Weight: 150 lb (68 kg)     Height: '5\' 4"'$  (1.626 m)     PainSc:  10-Worst pain ever 10-Worst pain ever 10-Worst pain ever    Isolation Precautions No active isolations  Medications Medications  ondansetron (ZOFRAN) injection 4 mg (4 mg Intravenous Given 07/17/17 0332)  morphine 4 MG/ML injection 4 mg (4 mg Intravenous Given 07/17/17 0333)   sodium chloride 0.9 % bolus 1,000 mL (0 mLs Intravenous Stopped 07/17/17 0426)  morphine 4 MG/ML injection 4 mg (4 mg Intravenous Given 07/17/17 0356)  sodium chloride 0.9 % bolus 1,000 mL (1,000 mLs Intravenous New Bag/Given 07/17/17 0427)    Mobility walks

## 2017-07-17 NOTE — Progress Notes (Signed)
[facility] Pt oriented to room,assessment complete. Patient complaining of abdominal pain 9/10. Notified J.Gardner notified of patient's arrival via Amion.

## 2017-07-17 NOTE — ED Notes (Signed)
Report to [facility] 

## 2017-07-17 NOTE — ED Notes (Signed)
ED Provider at bedside. 

## 2017-07-17 NOTE — ED Triage Notes (Signed)
Pt states she has been vomiting for the past 3 days and yesterday started having abd pain mainly in her upper abdomen up into her chest and going into her back  Pt actively vomiting in room   Last BM was about 4 days ago  Pt states she has not been able to eat anything

## 2017-07-17 NOTE — ED Provider Notes (Signed)
[facility] EMERGENCY DEPARTMENT Provider Note   CSN: [id number] Arrival date & time: 07/17/17  0300     History   Chief Complaint Chief Complaint  Patient presents with  . Emesis  . Abdominal Pain    HPI Tanya Wilson is a 33 y.o. female.  HPI Patient is a 32 year old female who presents the emergency department with 3 days of severe epigastric discomfort with associated nausea vomiting.  No diarrhea.  No fevers or chills.  No hematemesis.  She reports that she drinks "3 cups of liquor" every day but is been unable to drink these over the past several days.  She states that time she drinks heavier.  No prior history of pancreatitis.  No history of known gallstones.  No lower abdominal or urinary complaints.  Pain is severe in severity.  Patient reports inability to keep fluids or food down over the past several days. Past Medical History:  Diagnosis Date  . Anxiety   . Bipolar affective (HCC)   . ETOH abuse   . Reflux     Patient Active Problem List   Diagnosis Date Noted  . Acute pancreatitis 07/17/2017  . Alcohol abuse, continuous 09/26/2011    Class: Acute  . Bipolar affective disorder, current episode hypomanic (HCC) 09/26/2011    Class: Chronic    History reviewed. No pertinent surgical history.   OB History   None      Home Medications    Prior to Admission medications   Not on File    Family History History reviewed. No pertinent family history.  Social History Social History   Tobacco Use  . Smoking status: Current Some Day Smoker    Types: Cigarettes  . Smokeless tobacco: Never Used  Substance Use Topics  . Alcohol use: Yes    Alcohol/week: 1.8 oz    Types: 3 Cans of beer per week    Comment: last drink 5 days ago  . Drug use: Not Currently    Types: Marijuana     Allergies   Penicillins   Review of Systems Review of Systems  All other systems reviewed and are negative.    Physical Exam Updated Vital Signs BP  (!) 140/103 (BP Location: Left Arm)   Pulse 90   Temp 98 F (36.7 C) (Oral)   Resp 20   Ht 5\' 4"  (1.626 m)   Wt 68 kg (150 lb)   SpO2 99%   BMI 25.75 kg/m   Physical Exam  Constitutional: She is oriented to person, place, and time. She appears well-developed.  Dehydrated appearing  HENT:  Head: Normocephalic and atraumatic.  Eyes: EOM are normal.  Neck: Normal range of motion.  Cardiovascular: Normal rate, regular rhythm and normal heart sounds.  Pulmonary/Chest: Effort normal and breath sounds normal.  Abdominal: Soft. She exhibits no distension.  Epigastric tenderness without guarding or rebound  Musculoskeletal: Normal range of motion.  Neurological: She is alert and oriented to person, place, and time.  Skin: Skin is warm and dry.  Psychiatric: She has a normal mood and affect. Judgment normal.  Nursing note and vitals reviewed.    ED Treatments / Results  Labs (all labs ordered are listed, but only abnormal results are displayed) Labs Reviewed  CBC - Abnormal; Notable for the following components:      Result Value   WBC 10.6 (*)    Hemoglobin 15.2 (*)    MCV 102.8 (*)    MCH 35.9 (*)    All  other components within normal limits  COMPREHENSIVE METABOLIC PANEL - Abnormal; Notable for the following components:   Sodium 132 (*)    CO2 13 (*)    Glucose, Bld 161 (*)    Total Protein 8.5 (*)    AST 115 (*)    ALT 89 (*)    Alkaline Phosphatase 156 (*)    Total Bilirubin 1.9 (*)    Anion gap 21 (*)    All other components within normal limits  LIPASE, BLOOD - Abnormal; Notable for the following components:   Lipase 1,795 (*)    All other components within normal limits  URINALYSIS, ROUTINE W REFLEX MICROSCOPIC  PREGNANCY, URINE    EKG None  Radiology No results found.  Procedures Procedures (including critical care time)  Medications Ordered in ED Medications  ondansetron (ZOFRAN) injection 4 mg (4 mg Intravenous Given 07/17/17 0332)  morphine 4  MG/ML injection 4 mg (4 mg Intravenous Given 07/17/17 0333)  sodium chloride 0.9 % bolus 1,000 mL (0 mLs Intravenous Stopped 07/17/17 0426)  morphine 4 MG/ML injection 4 mg (4 mg Intravenous Given 07/17/17 0356)  sodium chloride 0.9 % bolus 1,000 mL (1,000 mLs Intravenous New Bag/Given 07/17/17 0427)     Initial Impression / Assessment and Plan / ED Course  I have reviewed the triage vital signs and the nursing notes.  Pertinent labs & imaging results that were available during my care of the patient were reviewed by me and considered in my medical decision making (see chart for details).     Labs consistent with acute pancreatitis.  This is likely alcoholic pancreatitis given her daily liquor use.  Patient will likely benefit from imaging.  Ultrasound would be the best option for her.  There is no ultrasound available here at this time.  [facility]  She will benefit from imaging when she arrives there.  Case discussed with Dr. Julian ReilGardner, tried hospitalist who accepts the patient in transfer to MedSurg bed  Pt updated  Dispo: admit  Consultants: Dr Julian ReilGardner, [facility]  Final Clinical Impressions(s) / ED Diagnoses   Final diagnoses:  Alcohol-induced acute pancreatitis, unspecified complication status    ED Discharge Orders    None       Azalia Bilisampos, Anicka Stuckert, MD 07/17/17 [id number]

## 2017-07-17 NOTE — H&P (Signed)
History and Physical    Tanya Wilson ZOX:[id number] DOB: 05/17/1985 DOA: 07/17/2017  PCP: No primary care provider on file.  [facility]   Chief Complaint: Nausea, vomiting, abdominal pain  HPI: Tanya Wilson is a 32 y.o. female with medical history significant of alcohol abuse who presents with epigastric abdominal pain, nausea, vomiting since Saturday. She admits to burning, ripping abdominal pain which worsens with food, nothing makes it better. She has not been able to keep down any food or liquids due to vomiting. She denies fevers, chills, other pain, diarrhea. She admits to daily liquor consumption of 3 "cups" of liquor. Last drink was a few days ago.   ED Course: Labs revealed lipase 1795, AST 115, ALT 89, WBC 10.6   Review of Systems: As per HPI otherwise 10 point review of systems negative.   Past Medical History:  Diagnosis Date  . Anxiety   . Bipolar affective (HCC)   . ETOH abuse   . Reflux     History reviewed. No pertinent surgical history.   reports that she has been smoking cigarettes.  She has never used smokeless tobacco. She reports that she drinks about 1.8 oz of alcohol per week. She reports that she has current or past drug history. Drug: Marijuana.  Allergies  Allergen Reactions  . Penicillins Hives    History reviewed. No pertinent family history.   Prior to Admission medications   Medication Sig Start Date End Date Taking? Authorizing Provider  ibuprofen (ADVIL,MOTRIN) 200 MG tablet Take 200 mg by mouth every 6 (six) hours as needed for moderate pain.   Yes [provider]  [name] 3-0.02 MG tablet Take 1 tablet by mouth daily.  07/05/17  Yes [provider]    Physical Exam: Vitals:   07/17/17 0305 07/17/17 0510 07/17/17 0600  BP: (!) 140/103 137/86 (!) 150/88  Pulse: 90 70 68  Resp: 20 14 15   Temp: 98 F (36.7 C) 97.8 F (36.6 C) 97.8 F (36.6 C)  TempSrc: Oral Oral Oral  SpO2: 99% 100% 100%  Weight:  68 kg (150 lb)    Height: 5\' 4"  (1.626 m)      Constitutional: NAD, calm, comfortable Eyes: PERRL, lids and conjunctivae normal ENMT: Mucous membranes are moist. Posterior pharynx clear of any exudate or lesions.Normal dentition.  Neck: normal, supple, no masses, no thyromegaly Respiratory: clear to auscultation bilaterally, no wheezing, no crackles. Normal respiratory effort. No accessory muscle use.  Cardiovascular: Regular rate and rhythm, no murmurs / rubs / gallops. No extremity edema.  Abdomen: TTP epigastric region, soft, nondistended, no masses palpated. No hepatosplenomegaly. Bowel sounds positive.  Musculoskeletal: no clubbing / cyanosis. No joint deformity upper and lower extremities. Good ROM, no contractures. Normal muscle tone.  Skin: no rashes, lesions, ulcers. No induration Neurologic: CN 2-12 grossly intact. Strength 5/5 in all 4.  Psychiatric: Normal judgment and insight. Alert and oriented x 3. Normal mood.   Labs on Admission: I have personally reviewed following labs and imaging studies  CBC: Recent Labs  Lab 07/17/17 0325  WBC 10.6*  HGB 15.2*  HCT 43.5  MCV 102.8*  PLT 371   Basic Metabolic Panel: Recent Labs  Lab 07/17/17 0325  NA 132*  K 3.9  CL 98  CO2 13*  GLUCOSE 161*  BUN 9  CREATININE 0.89  CALCIUM 9.1   GFR: Estimated Creatinine Clearance: 84.4 mL/min (by C-G formula based on SCr of 0.89 mg/dL). Liver Function Tests: Recent Labs  Lab 07/17/17  0325  AST 115*  ALT 89*  ALKPHOS 156*  BILITOT 1.9*  PROT 8.5*  ALBUMIN 4.3   Recent Labs  Lab 07/17/17 0325  LIPASE 1,795*   No results for input(s): AMMONIA in the last 168 hours. Coagulation Profile: No results for input(s): INR, PROTIME in the last 168 hours. Cardiac Enzymes: No results for input(s): CKTOTAL, CKMB, CKMBINDEX, TROPONINI in the last 168 hours. BNP (last 3 results) No results for input(s): PROBNP in the last 8760 hours. HbA1C: No results for input(s): HGBA1C in  the last 72 hours. CBG: No results for input(s): GLUCAP in the last 168 hours. Lipid Profile: No results for input(s): CHOL, HDL, LDLCALC, TRIG, CHOLHDL, LDLDIRECT in the last 72 hours. Thyroid Function Tests: No results for input(s): TSH, T4TOTAL, FREET4, T3FREE, THYROIDAB in the last 72 hours. Anemia Panel: No results for input(s): VITAMINB12, FOLATE, FERRITIN, TIBC, IRON, RETICCTPCT in the last 72 hours. Urine analysis:    Component Value Date/Time   COLORURINE YELLOW 07/17/2017 0700   APPEARANCEUR CLEAR 07/17/2017 0700   LABSPEC 1.019 07/17/2017 0700   PHURINE 5.0 07/17/2017 0700   GLUCOSEU 150 (A) 07/17/2017 0700   HGBUR SMALL (A) 07/17/2017 0700   BILIRUBINUR NEGATIVE 07/17/2017 0700   KETONESUR 80 (A) 07/17/2017 0700   PROTEINUR 30 (A) 07/17/2017 0700   NITRITE NEGATIVE 07/17/2017 0700   LEUKOCYTESUR NEGATIVE 07/17/2017 0700   Sepsis Labs: !!!!!!!!!!!!!!!!!!!!!!!!!!!!!!!!!!!!!!!!!!!! @LABRCNTIP (procalcitonin:4,lacticidven:4) )No results found for this or any previous visit (from the past 240 hour(s)).   Radiological Exams on Admission: US Abdomen Limited Ruq  Result Date: 07/17/2017 CLINICAL DATA:  Pancreatitis EXAM: ULTRASOUND ABDOMEN LIMITED RIGHT UPPER QUADRANT COMPARISON:  None. FINDINGS: Gallbladder: No gallstones or wall thickening visualized. No sonographic Murphy sign noted by sonographer. Common bile duct: Diameter: 5 mm. There is incomplete visualization. Where visualized, no filling defect. Liver: Echogenic liver with diminished acoustic penetration. No focal abnormality. Portal vein is patent on color Doppler imaging with normal direction of blood flow towards the liver. IMPRESSION: 1. No evidence of biliary calculus. 2. Hepatic steatosis Electronically Signed   By: Marnee Spring M.D.   On: 07/17/2017 08:43    Assessment/Plan Principal Problem:   Acute pancreatitis Active Problems:   Alcohol abuse, continuous   Elevated liver enzymes  Acute  pancreatitis -Likely alcohol induced -RUQ [place] without gallstones  -Check lipid panel  -Supportive care, IVF, pain control, antiemetic, NPO   Elevated liver enzyme -Trend  Alcohol abuse -Monitor for withdrawal, last drink 3 days ago. CIWA protocol ordered    DVT prophylaxis: Lovenox Code Status: Full  Family Communication: No family at bedside  Disposition Plan: Pending improvement, return home on discharge Consults called: None  Admission status: Inpatient   * I certify that at the point of admission it is my clinical judgment that the patient will require inpatient hospital care spanning beyond 2 midnights from the point of admission due to high intensity of service, high risk for further deterioration and high frequency of surveillance required.*   Noralee Stain, DO [facility] [email or web address] Password [facility] 07/17/2017, 8:58 AM

## 2017-07-17 NOTE — ED Notes (Signed)
Pt transferred to [facility] via [facility]. 

## 2017-07-18 ENCOUNTER — Inpatient Hospital Stay (HOSPITAL_COMMUNITY): Payer: Commercial Managed Care - PPO

## 2017-07-18 DIAGNOSIS — R748 Abnormal levels of other serum enzymes: Secondary | ICD-10-CM

## 2017-07-18 DIAGNOSIS — K852 Alcohol induced acute pancreatitis without necrosis or infection: Principal | ICD-10-CM

## 2017-07-18 DIAGNOSIS — F1010 Alcohol abuse, uncomplicated: Secondary | ICD-10-CM

## 2017-07-18 LAB — COMPREHENSIVE METABOLIC PANEL
ALT: 49 U/L — AB (ref 0–44)
AST: 52 U/L — AB (ref 15–41)
Albumin: 3.1 g/dL — ABNORMAL LOW (ref 3.5–5.0)
Alkaline Phosphatase: 95 U/L (ref 38–126)
Anion gap: 10 (ref 5–15)
BILIRUBIN TOTAL: 1.6 mg/dL — AB (ref 0.3–1.2)
BUN: 5 mg/dL — AB (ref 6–20)
CHLORIDE: 105 mmol/L (ref 98–111)
CO2: 18 mmol/L — ABNORMAL LOW (ref 22–32)
Calcium: 8.7 mg/dL — ABNORMAL LOW (ref 8.9–10.3)
Creatinine, Ser: 0.55 mg/dL (ref 0.44–1.00)
GFR calc Af Amer: >60 mL/min (ref >60)
GFR calc non Af Amer: >60 mL/min (ref >60)
Glucose, Bld: 134 mg/dL — ABNORMAL HIGH (ref 70–99)
POTASSIUM: 3.8 mmol/L (ref 3.5–5.1)
SODIUM: 133 mmol/L — AB (ref 135–145)
TOTAL PROTEIN: 6.6 g/dL (ref 6.5–8.1)

## 2017-07-18 LAB — CBC
HCT: 40.7 % (ref 36.0–46.0)
Hemoglobin: 13.9 g/dL (ref 12.0–15.0)
MCH: 36.1 pg — ABNORMAL HIGH (ref 26.0–34.0)
MCHC: 34.2 g/dL (ref 30.0–36.0)
MCV: 105.7 fL — ABNORMAL HIGH (ref 78.0–100.0)
PLATELETS: 237 10*3/uL (ref 150–400)
RBC: 3.85 MIL/uL — AB (ref 3.87–5.11)
RDW: 13.6 % (ref 11.5–15.5)
WBC: 9.2 10*3/uL (ref 4.0–10.5)

## 2017-07-18 LAB — HIV ANTIBODY (ROUTINE TESTING W REFLEX): HIV SCREEN 4TH GENERATION: NONREACTIVE

## 2017-07-18 MED ORDER — BISACODYL 10 MG RE SUPP
10.0000 mg | Freq: Once | RECTAL | Status: AC
  Administered 2017-07-18: 10 mg via RECTAL
  Filled 2017-07-18: qty 1

## 2017-07-18 MED ORDER — SENNOSIDES-DOCUSATE SODIUM 8.6-50 MG PO TABS
1.0000 | ORAL_TABLET | Freq: Two times a day (BID) | ORAL | Status: DC
  Administered 2017-07-18 – 2017-07-20 (×5): 1 via ORAL
  Filled 2017-07-18 (×5): qty 1

## 2017-07-18 NOTE — Progress Notes (Signed)
PROGRESS NOTE    Tanya Wilson  ZOX:[id number] DOB: [birth year] DOA: 07/17/2017 PCP: No primary care provider on file.   Brief Narrative: Tanya Wilson is a 32 y.o. female with medical history significant of alcohol abuse. She presented with epigastric pain/nausea/vomiting and found to have acute pancreatitis. She is being treated with IV analgesics and is currently NPO.   Assessment & Plan:   Principal Problem:   Acute pancreatitis Active Problems:   Alcohol abuse, continuous   Elevated liver enzymes   Acute pancreatitis Pain is unchanged from yesterday. Does have some bloating and lower abdominal pain today -NPO except ice chips -Continue morphine IV prn -Abdominal x-ray; if negative for SBO or ileus, will give suppository -Continue IV fluids  Elevated AST/ALT/Bilirubin Likely a mild component of alcohol hepatitis. Improved labs today.  Alcohol abuse No evidence of withdrawal currently -Continue CIWA, thiamine, multivitamin, folate   DVT prophylaxis: Lovenox Code Status:   Code Status: Full Code Family Communication: None at bedside Disposition Plan: Discharge pending transition to oral diet and wean off IV analgesics   Consultants:   None  Procedures:   None  Antimicrobials:  None    Subjective: Epigastric and lower abdominal pain today. Pain unchanged in epigastric area from yesterday. No nausea or vomiting. Some feeling of bloating. No flatus. No bowel movement.  Objective: Vitals:   07/17/17 1758 07/17/17 2026 07/18/17 0202 07/18/17 0604  BP: 117/79 121/83 (!) 134/95 112/73  Pulse: 80 74 97 71  Resp: 15 15 17 16   Temp: 97.6 F (36.4 C) 97.6 F (36.4 C) 98.2 F (36.8 C) (!) 97.4 F (36.3 C)  TempSrc: Axillary Oral Oral Oral  SpO2: 100% 100% 100% 100%  Weight:      Height:        Intake/Output Summary (Last 24 hours) at 07/18/2017 0959 Last data filed at 07/18/2017 0826 Gross per 24 hour  Intake 1787.08 ml  Output 900 ml  Net  887.08 ml   Filed Weights   07/17/17 0305  Weight: 68 kg (150 lb)    Examination:  General exam: Appears calm and comfortable Respiratory system: Clear to auscultation. Respiratory effort normal. Cardiovascular system: S1 & S2 heard, RRR. No murmurs, rubs, gallops or clicks. Gastrointestinal system: Abdomen is nondistended, soft and tender in epigastric and lower abdominal areas. No organomegaly or masses felt. Normal bowel sounds heard. Central nervous system: Alert and oriented. No focal neurological deficits. Extremities: No edema. No calf tenderness Skin: No cyanosis. No rashes Psychiatry: Judgement and insight appear normal. Mood & affect appropriate.     Data Reviewed: I have personally reviewed following labs and imaging studies  CBC: Recent Labs  Lab 07/17/17 0325 07/18/17 0534  WBC 10.6* 9.2  HGB 15.2* 13.9  HCT 43.5 40.7  MCV 102.8* 105.7*  PLT 371 237   Basic Metabolic Panel: Recent Labs  Lab 07/17/17 0325 07/18/17 0534  NA 132* 133*  K 3.9 3.8  CL 98 105  CO2 13* 18*  GLUCOSE 161* 134*  BUN 9 5*  CREATININE 0.89 0.55  CALCIUM 9.1 8.7*   GFR: Estimated Creatinine Clearance: 93.9 mL/min (by C-G formula based on SCr of 0.55 mg/dL). Liver Function Tests: Recent Labs  Lab 07/17/17 0325 07/18/17 0534  AST 115* 52*  ALT 89* 49*  ALKPHOS 156* 95  BILITOT 1.9* 1.6*  PROT 8.5* 6.6  ALBUMIN 4.3 3.1*   Recent Labs  Lab 07/17/17 0325  LIPASE 1,795*   No results for input(s): AMMONIA in the last 168  hours. Coagulation Profile: No results for input(s): INR, PROTIME in the last 168 hours. Cardiac Enzymes: No results for input(s): CKTOTAL, CKMB, CKMBINDEX, TROPONINI in the last 168 hours. BNP (last 3 results) No results for input(s): PROBNP in the last 8760 hours. HbA1C: No results for input(s): HGBA1C in the last 72 hours. CBG: No results for input(s): GLUCAP in the last 168 hours. Lipid Profile: Recent Labs    07/17/17 1108  CHOL 257*    HDL 98  LDLCALC 135*  TRIG 120  CHOLHDL 2.6   Thyroid Function Tests: No results for input(s): TSH, T4TOTAL, FREET4, T3FREE, THYROIDAB in the last 72 hours. Anemia Panel: No results for input(s): VITAMINB12, FOLATE, FERRITIN, TIBC, IRON, RETICCTPCT in the last 72 hours. Sepsis Labs: No results for input(s): PROCALCITON, LATICACIDVEN in the last 168 hours.  No results found for this or any previous visit (from the past 240 hour(s)).       Radiology Studies: [place] Abdomen Limited Ruq  Result Date: 07/17/2017 CLINICAL DATA:  Pancreatitis EXAM: ULTRASOUND ABDOMEN LIMITED RIGHT UPPER QUADRANT COMPARISON:  None. FINDINGS: Gallbladder: No gallstones or wall thickening visualized. No sonographic Murphy sign noted by sonographer. Common bile duct: Diameter: 5 mm. There is incomplete visualization. Where visualized, no filling defect. Liver: Echogenic liver with diminished acoustic penetration. No focal abnormality. Portal vein is patent on color Doppler imaging with normal direction of blood flow towards the liver. IMPRESSION: 1. No evidence of biliary calculus. 2. Hepatic steatosis Electronically Signed   By: Marnee SpringJonathon  Watts M.D.   On: 07/17/2017 08:43        Scheduled Meds: . enoxaparin (LOVENOX) injection  40 mg Subcutaneous Q24H  . folic acid  1 mg Oral Daily  . multivitamin with minerals  1 tablet Oral Daily  . thiamine  100 mg Oral Daily   Or  . thiamine  100 mg Intravenous Daily   Continuous Infusions: . sodium chloride 125 mL/hr at 07/18/17 0330     LOS: 1 day     Jacquelin Hawkingalph Yerlin Gasparyan, MD [facility] 07/18/2017, 9:59 AM Pager: [phone]  If 7PM-7AM, please contact night-coverage [email or web address] 07/18/2017, 9:59 AM

## 2017-07-19 MED ORDER — METRONIDAZOLE 0.75 % EX GEL
Freq: Two times a day (BID) | CUTANEOUS | Status: DC
  Administered 2017-07-19: 1 via TOPICAL
  Administered 2017-07-20: 11:00:00 via TOPICAL
  Filled 2017-07-19: qty 45

## 2017-07-19 NOTE — Progress Notes (Signed)
PROGRESS NOTE    Tanya Wilson  XLK:[id number] DOB: 06/30/1985 DOA: 07/17/2017 PCP: No primary care provider on file.   Brief Narrative: Tanya EvangelistChristina Hemmerich is a 32 y.o. female with medical history significant of alcohol abuse. She presented with epigastric pain/nausea/vomiting and found to have acute pancreatitis. She is being treated with IV analgesics and is currently NPO.   Assessment & Plan:   Principal Problem:   Acute pancreatitis Active Problems:   Alcohol abuse, continuous   Elevated liver enzymes   Acute pancreatitis Pain is improving -Clear liquid diet today, advance as tolerated -Continue morphine IV prn -Continue IV fluids until able to tolerate adequate oral intake  Elevated AST/ALT/Bilirubin Likely a mild component of alcohol hepatitis. Improved labs  Alcohol abuse No evidence of withdrawal currently -Continue CIWA, thiamine, multivitamin, folate  Facial rash Appears to be rosacea. -Metronidazole cream BID   DVT prophylaxis: Lovenox Code Status:   Code Status: Full Code Family Communication: None at bedside Disposition Plan: Discharge pending transition to oral diet and wean off IV analgesics   Consultants:   None  Procedures:   None  Antimicrobials:  None    Subjective: Pain improved. No vomiting.  Objective: Vitals:   07/19/17 0037 07/19/17 0630 07/19/17 1000 07/19/17 1230  BP: 113/81 130/80 109/70 129/90  Pulse: 98 97 91 93  Resp: 18 17 17 17   Temp: 98.4 F (36.9 C) 98.4 F (36.9 C) 99.3 F (37.4 C) 98.4 F (36.9 C)  TempSrc: Oral Oral Oral Oral  SpO2: 99% 100% 99% 100%  Weight:      Height:        Intake/Output Summary (Last 24 hours) at 07/19/2017 1441 Last data filed at 07/19/2017 1308 Gross per 24 hour  Intake 2740.83 ml  Output -  Net 2740.83 ml   Filed Weights   07/17/17 0305  Weight: 68 kg (150 lb)    Examination:  General exam: Appears calm and comfortable Respiratory system: Clear to auscultation.  Respiratory effort normal. Cardiovascular system: S1 & S2 heard, tachycardia, regular rhythm, no murmur Gastrointestinal system: Abdomen is nondistended, soft and nontender. Normal bowel sounds heard. Central nervous system: Alert and oriented. No focal neurological deficits. Extremities: No edema. No calf tenderness Skin: No cyanosis. Erythematous papular rash on bilateral cheeks Psychiatry: Judgement and insight appear normal. Mood & affect appropriate.      Data Reviewed: I have personally reviewed following labs and imaging studies  CBC: Recent Labs  Lab 07/17/17 0325 07/18/17 0534  WBC 10.6* 9.2  HGB 15.2* 13.9  HCT 43.5 40.7  MCV 102.8* 105.7*  PLT 371 237   Basic Metabolic Panel: Recent Labs  Lab 07/17/17 0325 07/18/17 0534  NA 132* 133*  K 3.9 3.8  CL 98 105  CO2 13* 18*  GLUCOSE 161* 134*  BUN 9 5*  CREATININE 0.89 0.55  CALCIUM 9.1 8.7*   GFR: Estimated Creatinine Clearance: 93.9 mL/min (by C-G formula based on SCr of 0.55 mg/dL). Liver Function Tests: Recent Labs  Lab 07/17/17 0325 07/18/17 0534  AST 115* 52*  ALT 89* 49*  ALKPHOS 156* 95  BILITOT 1.9* 1.6*  PROT 8.5* 6.6  ALBUMIN 4.3 3.1*   Recent Labs  Lab 07/17/17 0325  LIPASE 1,795*   No results for input(s): AMMONIA in the last 168 hours. Coagulation Profile: No results for input(s): INR, PROTIME in the last 168 hours. Cardiac Enzymes: No results for input(s): CKTOTAL, CKMB, CKMBINDEX, TROPONINI in the last 168 hours. BNP (last 3 results) No results for  input(s): PROBNP in the last 8760 hours. HbA1C: No results for input(s): HGBA1C in the last 72 hours. CBG: No results for input(s): GLUCAP in the last 168 hours. Lipid Profile: Recent Labs    07/17/17 1108  CHOL 257*  HDL 98  LDLCALC 135*  TRIG 120  CHOLHDL 2.6   Thyroid Function Tests: No results for input(s): TSH, T4TOTAL, FREET4, T3FREE, THYROIDAB in the last 72 hours. Anemia Panel: No results for input(s): VITAMINB12,  FOLATE, FERRITIN, TIBC, IRON, RETICCTPCT in the last 72 hours. Sepsis Labs: No results for input(s): PROCALCITON, LATICACIDVEN in the last 168 hours.  No results found for this or any previous visit (from the past 240 hour(s)).       Radiology Studies: Dg Abd Portable 1v  Result Date: 07/18/2017 CLINICAL DATA:  Abdominal distention. EXAM: PORTABLE ABDOMEN - 1 VIEW COMPARISON:  None. FINDINGS: The bowel gas pattern is normal. No radio-opaque calculi or other significant radiographic abnormality are seen. IMPRESSION: Negative. Electronically Signed   By: Obie Dredge M.D.   On: 07/18/2017 12:57        Scheduled Meds: . enoxaparin (LOVENOX) injection  40 mg Subcutaneous Q24H  . folic acid  1 mg Oral Daily  . multivitamin with minerals  1 tablet Oral Daily  . senna-docusate  1 tablet Oral BID  . thiamine  100 mg Oral Daily   Or  . thiamine  100 mg Intravenous Daily   Continuous Infusions: . sodium chloride 125 mL/hr at 07/19/17 1010     LOS: 2 days     Jacquelin Hawking, MD [facility] 07/19/2017, 2:41 PM Pager: [phone]  If 7PM-7AM, please contact night-coverage [email or web address] 07/19/2017, 2:41 PM

## 2017-07-20 DIAGNOSIS — R21 Rash and other nonspecific skin eruption: Secondary | ICD-10-CM

## 2017-07-20 MED ORDER — FOLIC ACID 1 MG PO TABS: 1.0000 mg | ORAL_TABLET | Freq: Every day | ORAL | Status: DC

## 2017-07-20 MED ORDER — ADULT MULTIVITAMIN W/MINERALS CH: 1.0000 | ORAL_TABLET | Freq: Every day | ORAL | Status: DC

## 2017-07-20 MED ORDER — ONDANSETRON HCL 4 MG PO TABS: 4.0000 mg | ORAL_TABLET | Freq: Four times a day (QID) | ORAL | 0 refills | Status: DC | PRN

## 2017-07-20 MED ORDER — CALCIUM CARBONATE ANTACID 500 MG PO CHEW: 1.0000 | CHEWABLE_TABLET | Freq: Three times a day (TID) | ORAL | Status: DC | PRN

## 2017-07-20 MED ORDER — THIAMINE HCL 100 MG PO TABS: 100.0000 mg | ORAL_TABLET | Freq: Every day | ORAL | Status: DC

## 2017-07-20 MED ORDER — CALCIUM CARBONATE ANTACID 500 MG PO CHEW
1.0000 | CHEWABLE_TABLET | Freq: Three times a day (TID) | ORAL | Status: DC
  Administered 2017-07-20: 200 mg via ORAL
  Filled 2017-07-20: qty 1

## 2017-07-20 MED ORDER — METRONIDAZOLE 0.75 % EX GEL: Freq: Two times a day (BID) | CUTANEOUS | 0 refills | Status: DC

## 2017-07-20 NOTE — Discharge Instructions (Signed)
Acute Pancreatitis  Acute pancreatitis is a condition in which the pancreas suddenly becomes irritated and swollen (has inflammation). The pancreas is a gland that is located behind the stomach. It produces enzymes that help to digest food. The pancreas also releases the hormones glucagon and insulin, which help to regulate blood sugar. Damage to the pancreas occurs when the digestive enzymes from the pancreas are activated before they are released into the intestine.  Most acute attacks last a couple of days and can cause serious problems. Some people become dehydrated and develop low blood pressure. In severe cases, bleeding into the pancreas can lead to shock and can be life-threatening. The lungs, heart, and kidneys may fail.  What are the causes?  The most common causes of this condition are:  · Alcohol abuse.  · Gallstones.    Other causes include:  · Certain medicines.  · Exposure to certain chemicals.  · Infection.  · Damage caused by an accident (trauma).  · Abdominal surgery.    In some cases, the cause may not be known.  What are the signs or symptoms?  Symptoms of this condition include:  · Pain in the upper abdomen that may radiate to the back.  · Tenderness and swelling of the abdomen.  · Nausea and vomiting.    How is this diagnosed?  This condition may be diagnosed based on:  · A physical exam.  · Blood tests.  · Imaging tests, such as X-rays, CT scans, or an ultrasound of the abdomen.    How is this treated?  Treatment for this condition usually requires a stay in the hospital. Treatment may include:  · Pain medicine.  · Fluid replacement through an IV tube.  · Placing a tube in the stomach to remove stomach contents and to control vomiting (NG tube, or nasogastric tube).  · Not eating for 3-4 days. This gives the pancreas a rest, because enzymes are not being produced that can cause further damage.  · Antibiotic medicines, if your condition is caused by an infection.  · Surgery on the pancreas or  gallbladder.    Follow these instructions at home:  Eating and drinking  · Follow instructions from your health care provider about diet. This may involve avoiding alcohol and decreasing the amount of fat in your diet.  · Eat smaller, more frequent meals. This reduces the amount of digestive fluids that the pancreas produces.  · Drink enough fluid to keep your urine clear or pale yellow.  · Do not drink alcohol if it caused your condition.  General instructions  · Take over-the-counter and prescription medicines only as told by your health care provider.  · Do not use any tobacco products, such as cigarettes, chewing tobacco, and e-cigarettes. If you need help quitting, ask your health care provider.  · Get plenty of rest.  · If directed, check your blood sugar at home as told by your health care provider.  · Keep all follow-up visits as told by your health care provider. This is important.  Contact a health care provider if:  · You do not recover as quickly as expected.  · You develop new or worsening symptoms.  · You have persistent pain, weakness, or nausea.  · You recover and then have another episode of pain.  · You have a fever.  Get help right away if:  · You cannot eat or keep fluids down.  · Your pain becomes severe.  · Your skin or the   white part of your eyes turns yellow (jaundice).  · You vomit.  · You feel dizzy or you faint.  · Your blood sugar is high (over 300 mg/dL).  This information is not intended to replace advice given to you by your health care provider. Make sure you discuss any questions you have with your health care provider.  Document Released: 01/09/2005 Document Revised: 05/19/2015 Document Reviewed: 10/13/2014  Elsevier Interactive Patient Education © 2018 [employer]vier Inc[employer]

## 2017-07-20 NOTE — Discharge Summary (Signed)
Physician Discharge Summary  Tanya Wilson ZOX:[id number] DOB: 1985-01-28 DOA: 07/17/2017  PCP: No primary care provider on file.  Admit date: 07/17/2017 Discharge date: 07/20/2017  Admitted From: Home Disposition: Home  Recommendations for Outpatient Follow-up:  1. Follow up with PCP in 1 week 2. Please obtain CMP/CBC in one week 3. Please follow up on the following pending results: None  Home Health: None Equipment/Devices: None  Discharge Condition: Stable CODE STATUS: Full code Diet recommendation: Regular diet   Brief/Interim Summary:  Admission HPI written by Noralee Stain, DO   Chief Complaint: Nausea, vomiting, abdominal pain  HPI: Tanya Wilson is a 32 y.o. female with medical history significant of alcohol abuse who presents with epigastric abdominal pain, nausea, vomiting since Saturday. She admits to burning, ripping abdominal pain which worsens with food, nothing makes it better. She has not been able to keep down any food or liquids due to vomiting. She denies fevers, chills, other pain, diarrhea. She admits to daily liquor consumption of 3 "cups" of liquor. Last drink was a few days ago.   ED Course: Labs revealed lipase 1795, AST 115, ALT 89, WBC 10.6     Hospital course:  Acute pancreatitis Patient with a lipase of 1795 on admission in addition to epigastric pain.  She is started on IV fluids and given IV pain medication for symptoms in addition to n.p.o. status.  Pain improved with IV fluids and n.p.o. management.  Diet was advanced slowly with increased patient tolerance and reduce pain.  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 Improved labs.  Recheck as an outpatient  Alcohol abuse No evidence of withdrawal currently. Continue CIWA, thiamine, multivitamin, folate.  Patient stated that she would be able to manage on her own without need for resources  Facial rash Appears to be rosacea.  Started on  metronidazole topical cream continue on discharge and follow-up for improvement  Proteinuria Present on urinalysis.  Will need outpatient follow-up.   Discharge Diagnoses:  Principal Problem:   Acute pancreatitis Active Problems:   Alcohol abuse, continuous   Elevated liver enzymes    Discharge Instructions  Discharge Instructions    Call MD for:  persistant nausea and vomiting   Complete by:  As directed    Call MD for:  severe uncontrolled pain   Complete by:  As directed    Diet - low sodium heart healthy   Complete by:  As directed    Increase activity slowly   Complete by:  As directed      Allergies as of 07/20/2017      Reactions   Penicillins Hives      Medication List    TAKE these medications   calcium carbonate 500 MG chewable tablet Commonly known as:  TUMS - dosed in mg elemental calcium Chew 1 tablet (200 mg of elemental calcium total) by mouth 3 (three) times daily with meals as needed for indigestion or heartburn.   folic acid 1 MG tablet Commonly known as:  FOLVITE Take 1 tablet (1 mg total) by mouth daily. Start taking on:  07/21/2017   ibuprofen 200 MG tablet Commonly known as:  ADVIL,MOTRIN Take 200 mg by mouth every 6 (six) hours as needed for moderate pain.   metroNIDAZOLE 0.75 % gel Commonly known as:  METROGEL Apply topically 2 (two) times daily.   multivitamin with minerals Tabs tablet Take 1 tablet by mouth daily. Start taking on:  07/21/2017   [name] 3-0.02 MG tablet Generic  drug:  drospirenone-ethinyl estradiol Take 1 tablet by mouth daily.   ondansetron 4 MG tablet Commonly known as:  ZOFRAN Take 1 tablet (4 mg total) by mouth every 6 (six) hours as needed for nausea.   thiamine 100 MG tablet Take 1 tablet (100 mg total) by mouth daily. Start taking on:  07/21/2017      Follow-up Information    Tracey HarriesBouska, David, MD. Schedule an appointment as soon as possible for a visit in 1 week(s).   Specialty:  Family Medicine Contact  information: [address] [place] [address] [phone]          Allergies  Allergen Reactions  . Penicillins Hives    Consultations:  None   Procedures/Studies: Dg Abd Portable 1v  Result Date: 07/18/2017 CLINICAL DATA:  Abdominal distention. EXAM: PORTABLE ABDOMEN - 1 VIEW COMPARISON:  None. FINDINGS: The bowel gas pattern is normal. No radio-opaque calculi or other significant radiographic abnormality are seen. IMPRESSION: Negative. Electronically Signed   By: Obie DredgeWilliam T Derry M.D.   On: 07/18/2017 12:57   [place] Abdomen Limited Ruq  Result Date: 07/17/2017 CLINICAL DATA:  Pancreatitis EXAM: ULTRASOUND ABDOMEN LIMITED RIGHT UPPER QUADRANT COMPARISON:  None. FINDINGS: Gallbladder: No gallstones or wall thickening visualized. No sonographic Murphy sign noted by sonographer. Common bile duct: Diameter: 5 mm. There is incomplete visualization. Where visualized, no filling defect. Liver: Echogenic liver with diminished acoustic penetration. No focal abnormality. Portal vein is patent on color Doppler imaging with normal direction of blood flow towards the liver. IMPRESSION: 1. No evidence of biliary calculus. 2. Hepatic steatosis Electronically Signed   By: Marnee SpringJonathon  Watts M.D.   On: 07/17/2017 08:43      Subjective: Pain improved today.  She has some indigestion.  Discharge Exam: Vitals:   07/20/17 0615 07/20/17 1221  BP: 123/85 128/85  Pulse: 79 93  Resp: 16 16  Temp: 98.5 F (36.9 C)   SpO2: 99% 100%   Vitals:   07/19/17 1813 07/20/17 0011 07/20/17 0615 07/20/17 1221  BP: 114/86 125/89 123/85 128/85  Pulse: 92 78 79 93  Resp: 16 20 16 16   Temp: 98.3 F (36.8 C) 98.1 F (36.7 C) 98.5 F (36.9 C)   TempSrc: Oral Oral Oral   SpO2: 100% 100% 99% 100%  Weight:      Height:        General: Pt is alert, awake, not in acute distress Cardiovascular: RRR, S1/S2 +, no rubs, no gallops Respiratory: CTA bilaterally, no wheezing, no rhonchi Abdominal:  Soft, NT, ND, bowel sounds + Extremities: no edema, no cyanosis    The results of significant diagnostics from this hospitalization (including imaging, microbiology, ancillary and laboratory) are listed below for reference.     Microbiology: No results found for this or any previous visit (from the past 240 hour(s)).   Labs: BNP (last 3 results) No results for input(s): BNP in the last 8760 hours. Basic Metabolic Panel: Recent Labs  Lab 07/17/17 0325 07/18/17 0534  NA 132* 133*  K 3.9 3.8  CL 98 105  CO2 13* 18*  GLUCOSE 161* 134*  BUN 9 5*  CREATININE 0.89 0.55  CALCIUM 9.1 8.7*   Liver Function Tests: Recent Labs  Lab 07/17/17 0325 07/18/17 0534  AST 115* 52*  ALT 89* 49*  ALKPHOS 156* 95  BILITOT 1.9* 1.6*  PROT 8.5* 6.6  ALBUMIN 4.3 3.1*   Recent Labs  Lab 07/17/17 0325  LIPASE 1,795*   No results for input(s):  AMMONIA in the last 168 hours. CBC: Recent Labs  Lab 07/17/17 0325 07/18/17 0534  WBC 10.6* 9.2  HGB 15.2* 13.9  HCT 43.5 40.7  MCV 102.8* 105.7*  PLT 371 237   Cardiac Enzymes: No results for input(s): CKTOTAL, CKMB, CKMBINDEX, TROPONINI in the last 168 hours. BNP: Invalid input(s): POCBNP CBG: No results for input(s): GLUCAP in the last 168 hours. D-Dimer No results for input(s): DDIMER in the last 72 hours. Hgb A1c No results for input(s): HGBA1C in the last 72 hours. Lipid Profile No results for input(s): CHOL, HDL, LDLCALC, TRIG, CHOLHDL, LDLDIRECT in the last 72 hours. Thyroid function studies No results for input(s): TSH, T4TOTAL, T3FREE, THYROIDAB in the last 72 hours.  Invalid input(s): FREET3 Anemia work up No results for input(s): VITAMINB12, FOLATE, FERRITIN, TIBC, IRON, RETICCTPCT in the last 72 hours. Urinalysis    Component Value Date/Time   COLORURINE YELLOW 07/17/2017 0700   APPEARANCEUR CLEAR 07/17/2017 0700   LABSPEC 1.019 07/17/2017 0700   PHURINE 5.0 07/17/2017 0700   GLUCOSEU 150 (A) 07/17/2017 0700    HGBUR SMALL (A) 07/17/2017 0700   BILIRUBINUR NEGATIVE 07/17/2017 0700   KETONESUR 80 (A) 07/17/2017 0700   PROTEINUR 30 (A) 07/17/2017 0700   NITRITE NEGATIVE 07/17/2017 0700   LEUKOCYTESUR NEGATIVE 07/17/2017 0700     SIGNED:   Jacquelin Hawking, MD [facility] 07/20/2017, 2:22 PM

## 2017-07-20 NOTE — Progress Notes (Signed)
Pt was provided with discharge instructions. After reviewing the instructions, the pt reports no further questions or concerns.

## 2018-01-22 IMAGING — CT CT HEAD W/O CM
4 of 6 series · 15 of 47 positions shown, 17 images · non-contrast
Comparison: None.

CLINICAL DATA: Fall down steps on [REDACTED], +LOC, pt states that she
hit the back of her head, neck pain, blurred vision, dizzy, tired

EXAM:
CT HEAD WITHOUT CONTRAST
CT CERVICAL SPINE WITHOUT CONTRAST
TECHNIQUE: Multidetector CT imaging of the head and cervical spine was
performed following the standard protocol without intravenous
contrast. Multiplanar CT image reconstructions of the cervical spine
were also generated.

[Series 2: head 5.0 h30s · axial · 0.40mm/px · z∈[-141,-26]mm · 7 of 31 slices shown, 9 images]
[im 4/31  brain]
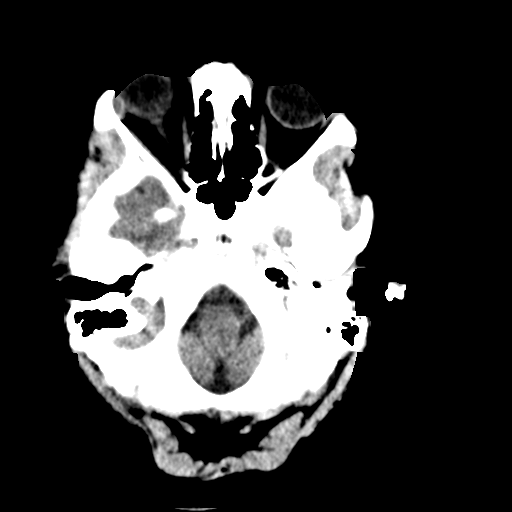
[im 4/31  bone]
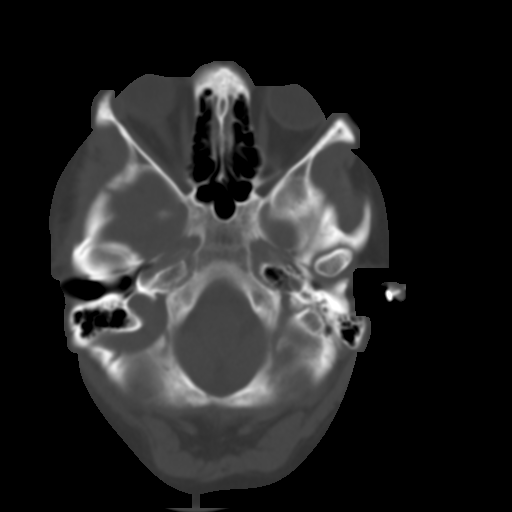
[im 8/31  brain]
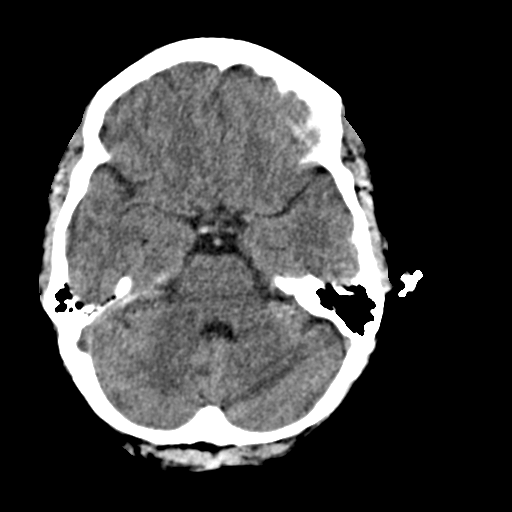
[im 12/31  brain]
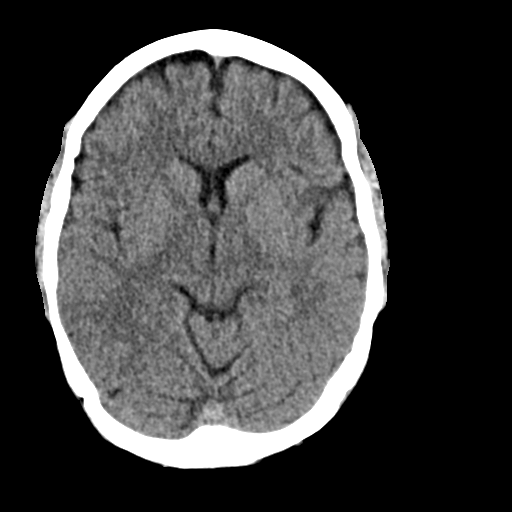
[im 16/31  brain]
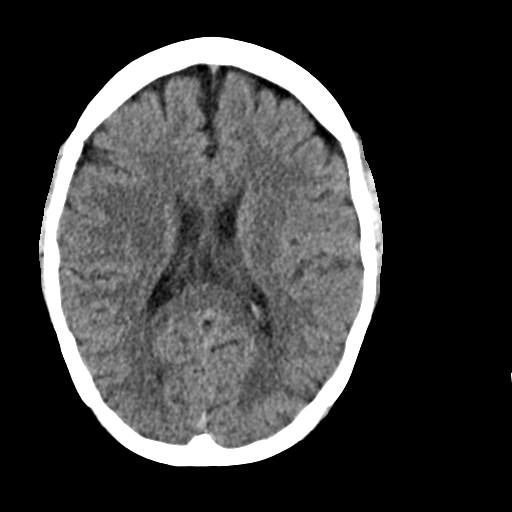
[im 19/31  brain]
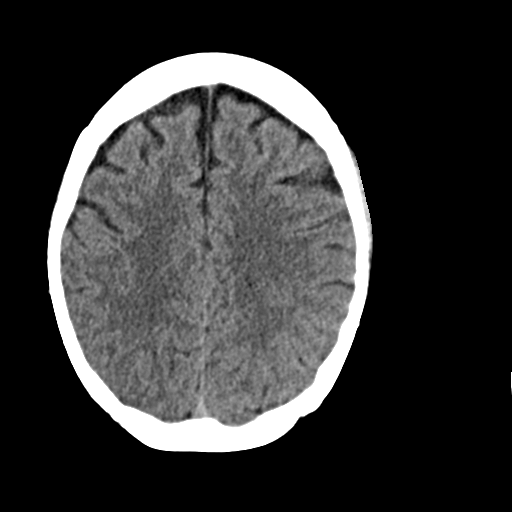
[im 19/31  bone]
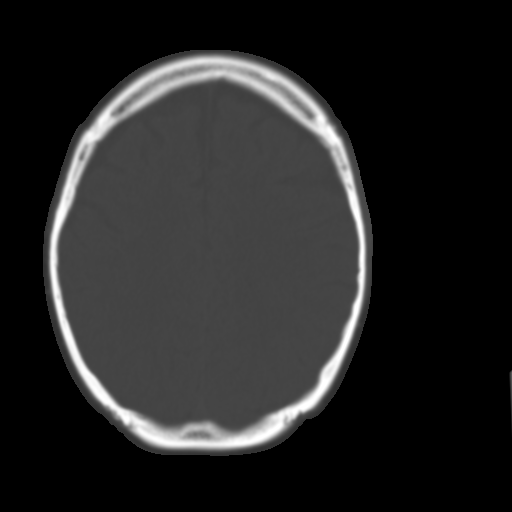
[im 23/31  brain]
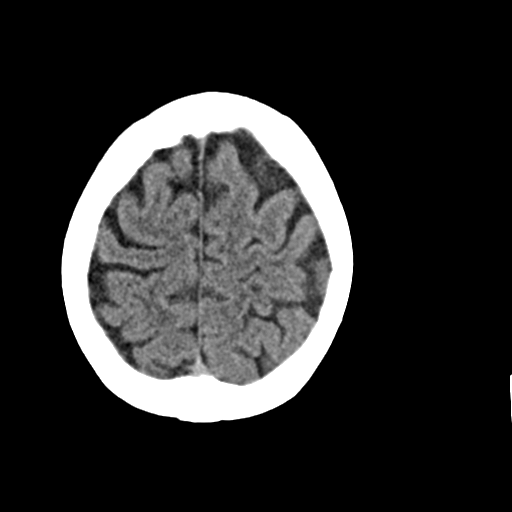
[im 27/31  brain]
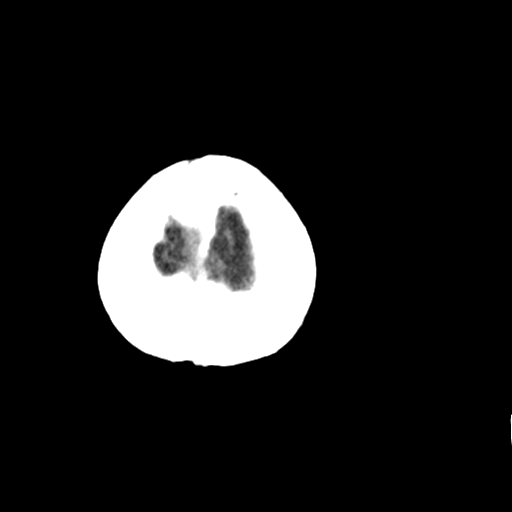

[Series 5: head 3.0 mpr sag · sagittal · 0.28mm/px · 2 of 53 slices shown]
[im 18/53  brain]
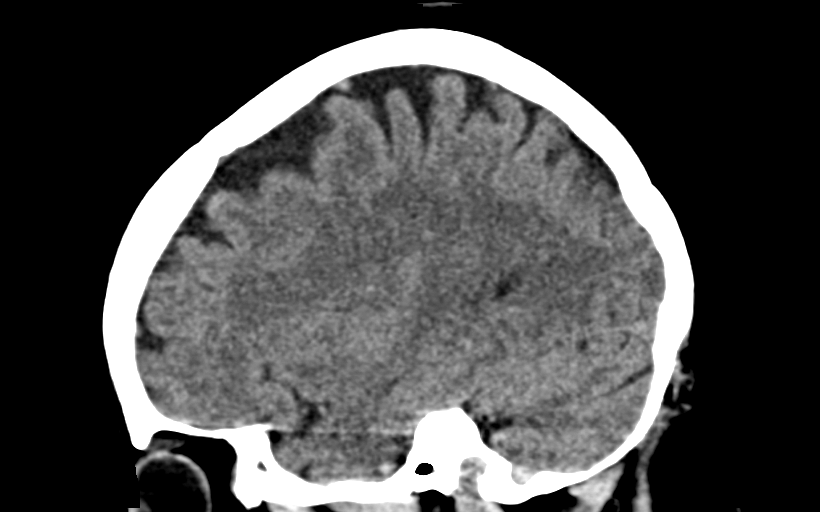
[im 35/53  brain]
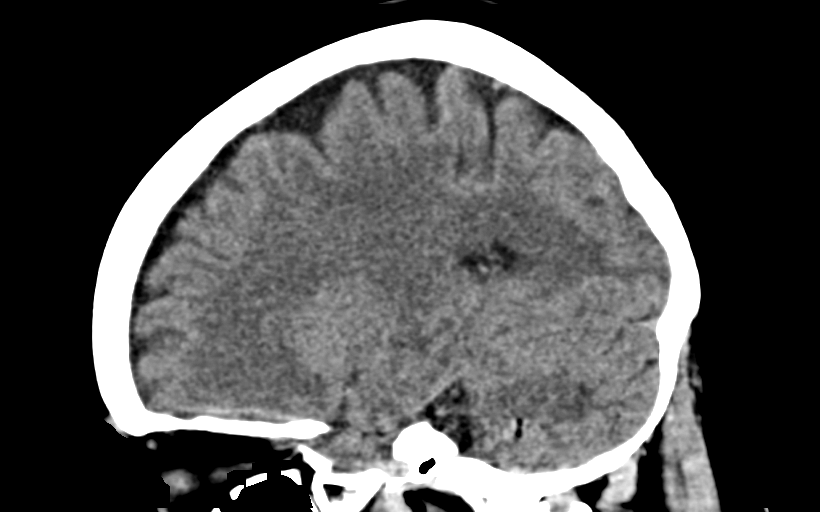

[Series 7: c_spine 2.0 i30s 3 · axial · 0.30mm/px · z∈[-252,-192]mm · 5 of 67 slices shown]
[im 7/67  brain]
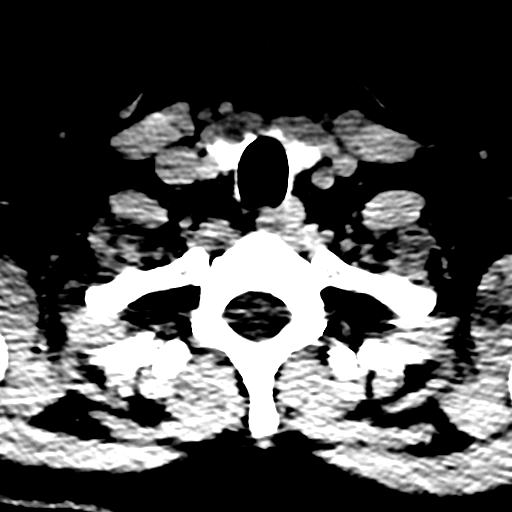
[im 14/67  brain]
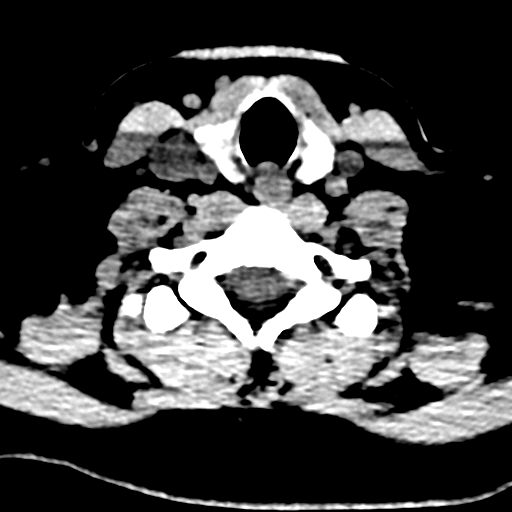
[im 20/67  brain]
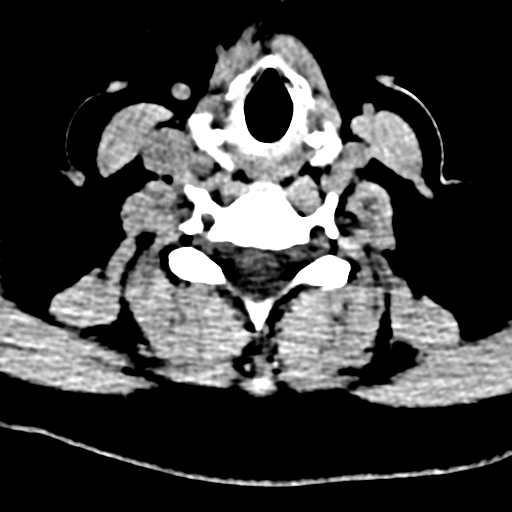
[im 30/67  brain]
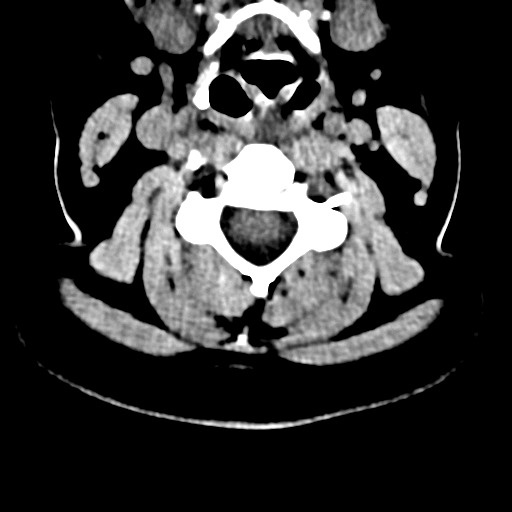
[im 37/67  brain]
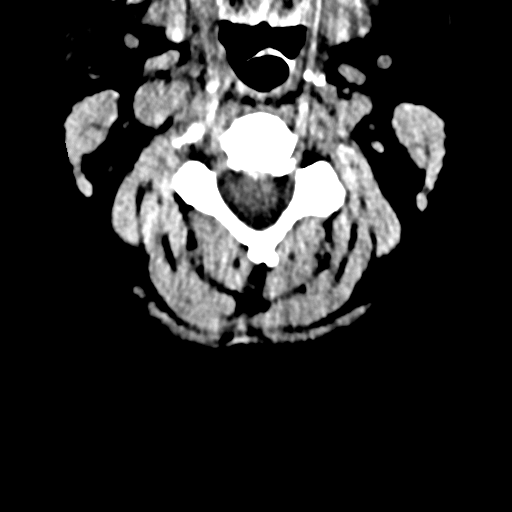

[Series 9: coronals · coronal · 0.22mm/px · 1 of 25 slices shown]
[im 13/25  brain]
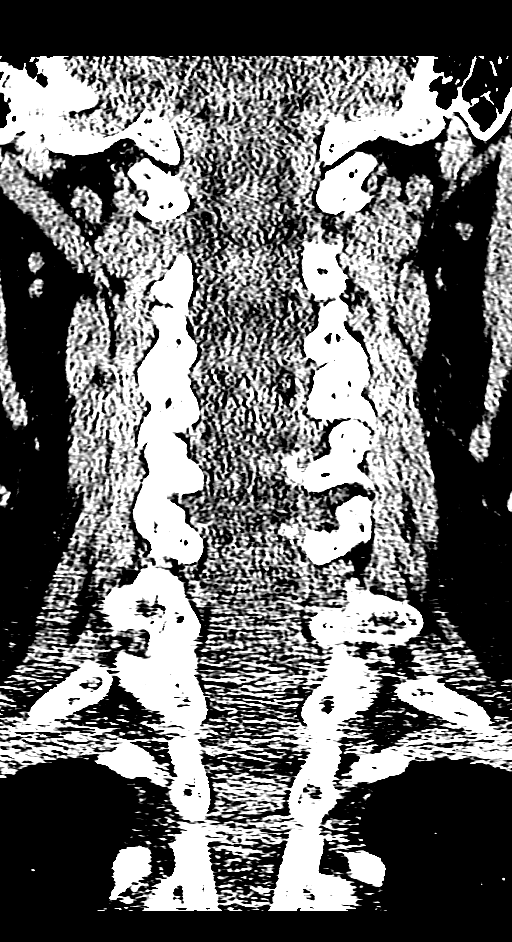

[15 of 47 positions shown; findings below may reference images not displayed]

FINDINGS: CT HEAD FINDINGS

Brain: No evidence of acute infarction, hemorrhage, hydrocephalus,
extra-axial collection or mass lesion/mass effect.

Vascular: No hyperdense vessel or unexpected calcification.

Skull: Normal. Negative for fracture or focal lesion.

Sinuses/Orbits: Visualized globes orbits are unremarkable.
Visualized sinuses and mastoid air cells are clear.

Other: None.

CT CERVICAL SPINE FINDINGS

Alignment: Normal.

Skull base and vertebrae: No acute fracture. No primary bone lesion
or focal pathologic process.

Soft tissues and spinal canal: No prevertebral fluid or swelling. No
visible canal hematoma.

Disc levels: No disc bulging or disc herniation. No degenerative
change. The central spinal canal and neural foramina are well
preserved.

Upper chest: Negative.

Other: None.
IMPRESSION: HEAD CT:  No intracranial abnormality.  No skull fracture.

CERVICAL CT:  Normal.

## 2019-02-13 ENCOUNTER — Encounter (HOSPITAL_COMMUNITY): Payer: Self-pay

## 2019-02-13 ENCOUNTER — Other Ambulatory Visit: Payer: Self-pay

## 2019-02-13 ENCOUNTER — Emergency Department (HOSPITAL_COMMUNITY)
Admission: EM | Admit: 2019-02-13 | Discharge: 2019-02-13 | Discharge status: Against Medical Advice | Payer: Managed Care, Other (non HMO) | Attending: Emergency Medicine | Admitting: Emergency Medicine

## 2019-02-13 DIAGNOSIS — Y908 Blood alcohol level of 240 mg/100 ml or more: Secondary | ICD-10-CM | POA: Insufficient documentation

## 2019-02-13 DIAGNOSIS — F17210 Nicotine dependence, cigarettes, uncomplicated: Secondary | ICD-10-CM | POA: Insufficient documentation

## 2019-02-13 DIAGNOSIS — Z79899 Other long term (current) drug therapy: Secondary | ICD-10-CM | POA: Diagnosis not present

## 2019-02-13 DIAGNOSIS — F10920 Alcohol use, unspecified with intoxication, uncomplicated: Secondary | ICD-10-CM

## 2019-02-13 DIAGNOSIS — F10129 Alcohol abuse with intoxication, unspecified: Secondary | ICD-10-CM | POA: Insufficient documentation

## 2019-02-13 LAB — CBC
HCT: 43.1 % (ref 36.0–46.0)
Hemoglobin: 14.6 g/dL (ref 12.0–15.0)
MCH: 35 pg — ABNORMAL HIGH (ref 26.0–34.0)
MCHC: 33.9 g/dL (ref 30.0–36.0)
MCV: 103.4 fL — ABNORMAL HIGH (ref 80.0–100.0)
Platelets: 544 10*3/uL — ABNORMAL HIGH (ref 150–400)
RBC: 4.17 MIL/uL (ref 3.87–5.11)
RDW: 12.2 % (ref 11.5–15.5)
WBC: 7.4 10*3/uL (ref 4.0–10.5)
nRBC: 0 % (ref 0.0–0.2)

## 2019-02-13 LAB — COMPREHENSIVE METABOLIC PANEL
ALT: 44 U/L (ref 0–44)
AST: 48 U/L — ABNORMAL HIGH (ref 15–41)
Albumin: 4.3 g/dL (ref 3.5–5.0)
Alkaline Phosphatase: 101 U/L (ref 38–126)
Anion gap: 14 (ref 5–15)
BUN: <5 mg/dL — ABNORMAL LOW (ref 6–20)
CO2: 26 mmol/L (ref 22–32)
Calcium: 9.3 mg/dL (ref 8.9–10.3)
Chloride: 103 mmol/L (ref 98–111)
Creatinine, Ser: 0.58 mg/dL (ref 0.44–1.00)
GFR calc Af Amer: >60 mL/min (ref >60)
GFR calc non Af Amer: >60 mL/min (ref >60)
Glucose, Bld: 255 mg/dL — ABNORMAL HIGH (ref 70–99)
Potassium: 3.5 mmol/L (ref 3.5–5.1)
Sodium: 143 mmol/L (ref 135–145)
Total Bilirubin: 0.5 mg/dL (ref 0.3–1.2)
Total Protein: 8.2 g/dL — ABNORMAL HIGH (ref 6.5–8.1)

## 2019-02-13 LAB — ETHANOL: Alcohol, Ethyl (B): 372 mg/dL (ref <10)

## 2019-02-13 MED ORDER — THIAMINE HCL 100 MG/ML IJ SOLN
Freq: Once | INTRAVENOUS | Status: DC
  Filled 2019-02-13: qty 1000

## 2019-02-13 NOTE — ED Notes (Signed)
Tanya Wilson, fiance, wants update, [phone].

## 2019-02-13 NOTE — ED Provider Notes (Signed)
[facility] DEPT Provider Note   CSN: [id number] Arrival date & time: 02/13/19  1728     History Chief Complaint  Patient presents with  . Alcohol Intoxication    Tanya Wilson is a 34 y.o. female.  Patient presents with etoh intoxication. Per report, was drinking 'all day', went to restaurant to get some food and 'passed out'. Symptoms acute onset, episodic, moderate, worse after standing. Denies loss of consciousness, pain, or injury. +nausea. No vomiting. Denies headache. No neck or back pain. No chest pain or discomfort. No sob. No abd pain or vomiting. No fever or chills.   The history is provided by the patient and the EMS personnel. The history is limited by the condition of the patient.  Alcohol Intoxication Pertinent negatives include no chest pain, no abdominal pain, no headaches and no shortness of breath.       Past Medical History:  Diagnosis Date  . Anxiety   . Bipolar affective ([place])   . ETOH abuse   . Reflux     Patient Active Problem List   Diagnosis Date Noted  . Acute pancreatitis 07/17/2017  . Elevated liver enzymes 07/17/2017  . Alcohol abuse, continuous 09/26/2011    Class: Acute  . Bipolar affective disorder, current episode hypomanic ([place]) 09/26/2011    Class: Chronic    History reviewed. No pertinent surgical history.   OB History   No obstetric history on file.     History reviewed. No pertinent family history.  Social History   Tobacco Use  . Smoking status: Current Some Day Smoker    Types: Cigarettes  . Smokeless tobacco: Never Used  Substance Use Topics  . Alcohol use: Yes    Alcohol/week: 3.0 standard drinks    Types: 3 Cans of beer per week    Comment: last drink 5 days ago  . Drug use: Not Currently    Types: Marijuana    Home Medications Prior to Admission medications   Medication Sig Start Date End Date Taking? Authorizing Provider  calcium carbonate (TUMS - DOSED IN MG ELEMENTAL  CALCIUM) 500 MG chewable tablet Chew 1 tablet (200 mg of elemental calcium total) by mouth 3 (three) times daily with meals as needed for indigestion or heartburn. 07/20/17   Mariel Aloe, MD  folic acid (FOLVITE) 1 MG tablet Take 1 tablet (1 mg total) by mouth daily. 07/21/17   Mariel Aloe, MD  ibuprofen (ADVIL,MOTRIN) 200 MG tablet Take 200 mg by mouth every 6 (six) hours as needed for moderate pain.    [provider]  metroNIDAZOLE (METROGEL) 0.75 % gel Apply topically 2 (two) times daily. 07/20/17   Mariel Aloe, MD  Multiple Vitamin (MULTIVITAMIN WITH MINERALS) TABS tablet Take 1 tablet by mouth daily. 07/21/17   Mariel Aloe, MD  [name] 3-0.02 MG tablet Take 1 tablet by mouth daily.  07/05/17   [provider]  ondansetron (ZOFRAN) 4 MG tablet Take 1 tablet (4 mg total) by mouth every 6 (six) hours as needed for nausea. 07/20/17   Mariel Aloe, MD  thiamine 100 MG tablet Take 1 tablet (100 mg total) by mouth daily. 07/21/17   Mariel Aloe, MD    Allergies    Penicillins  Review of Systems   Review of Systems  Constitutional: Negative for fever.  HENT: Negative for sore throat.   Eyes: Negative for redness.  Respiratory: Negative for cough and shortness of breath.   Cardiovascular: Negative for  chest pain.  Gastrointestinal: Negative for abdominal pain.  Genitourinary: Negative for flank pain.  Musculoskeletal: Negative for back pain and neck pain.  Skin: Negative for rash.  Neurological: Negative for weakness, numbness and headaches.  Hematological: Does not bruise/bleed easily.  Psychiatric/Behavioral: Negative for agitation.    Physical Exam Updated Vital Signs BP 108/62   Pulse 70   Temp 98.1 F (36.7 C) (Oral)   Resp 18   SpO2 99%   Physical Exam Vitals and nursing note reviewed.  Constitutional:      Appearance: Normal appearance. She is well-developed.  HENT:     Head: Atraumatic.     Nose: Nose normal.     Mouth/Throat:      Mouth: Mucous membranes are moist.  Eyes:     General: No scleral icterus.    Conjunctiva/sclera: Conjunctivae normal.     Pupils: Pupils are equal, round, and reactive to light.  Neck:     Trachea: No tracheal deviation.  Cardiovascular:     Rate and Rhythm: Normal rate and regular rhythm.     Pulses: Normal pulses.     Heart sounds: Normal heart sounds. No murmur. No friction rub. No gallop.   Pulmonary:     Effort: Pulmonary effort is normal. No respiratory distress.     Breath sounds: Normal breath sounds.  Abdominal:     General: Bowel sounds are normal. There is no distension.     Palpations: Abdomen is soft.     Tenderness: There is no abdominal tenderness. There is no guarding.  Genitourinary:    Comments: No cva tenderness.  Musculoskeletal:        General: No swelling.     Cervical back: Normal range of motion and neck supple. No rigidity. No muscular tenderness.  Skin:    General: Skin is warm and dry.     Findings: No rash.  Neurological:     Mental Status: She is alert.     Comments: Alert. Appears intoxicated. Is able to ambulate to bathroom slowly, but no ataxia or fall.   Psychiatric:     Comments: Intoxicated. Denies thoughts of harm to self or others.      ED Results / Procedures / Treatments   Labs (all labs ordered are listed, but only abnormal results are displayed) Results for orders placed or performed during the hospital encounter of 02/13/19  CBC  Result Value Ref Range   WBC 7.4 4.0 - 10.5 K/uL   RBC 4.17 3.87 - 5.11 MIL/uL   Hemoglobin 14.6 12.0 - 15.0 g/dL   HCT 66.4 40.3 - 47.4 %   MCV 103.4 (H) 80.0 - 100.0 fL   MCH 35.0 (H) 26.0 - 34.0 pg   MCHC 33.9 30.0 - 36.0 g/dL   RDW 25.9 56.3 - 87.5 %   Platelets 544 (H) 150 - 400 K/uL   nRBC 0.0 0.0 - 0.2 %  Comprehensive metabolic panel  Result Value Ref Range   Sodium 143 135 - 145 mmol/L   Potassium 3.5 3.5 - 5.1 mmol/L   Chloride 103 98 - 111 mmol/L   CO2 26 22 - 32 mmol/L   Glucose,  Bld 255 (H) 70 - 99 mg/dL   BUN <5 (L) 6 - 20 mg/dL   Creatinine, Ser 6.43 0.44 - 1.00 mg/dL   Calcium 9.3 8.9 - 32.9 mg/dL   Total Protein 8.2 (H) 6.5 - 8.1 g/dL   Albumin 4.3 3.5 - 5.0 g/dL   AST 48 (H) 15 -  41 U/L   ALT 44 0 - 44 U/L   Alkaline Phosphatase 101 38 - 126 U/L   Total Bilirubin 0.5 0.3 - 1.2 mg/dL   GFR calc non Af Amer >60 >60 mL/min   GFR calc Af Amer >60 >60 mL/min   Anion gap 14 5 - 15  Ethanol  Result Value Ref Range   Alcohol, Ethyl (B) 372 (HH) <10 mg/dL    EKG None  Radiology No results found.  Procedures Procedures (including critical care time)  Medications Ordered in ED Medications - No data to display  ED Course  I have reviewed the triage vital signs and the nursing notes.  Pertinent labs & imaging results that were available during my care of the patient were reviewed by me and considered in my medical decision making (see chart for details).    MDM Rules/Calculators/A&P                      Labs sent.   Reviewed nursing notes and prior charts for additional history.   Po fluids.  Labs reviewed/interpreted by me - etoh v high, 372.   Iv ns bolus w thiamine, mvi.   Po fluids.  Recheck, alert, ambulatory.   Patient indicates has called family member for ride home.   Went to reassess patient - rn indicates pt had left ED with ride/spouse prior to additional reassessment, and that pt was ambulatory w steady gait to vehicle.        Final Clinical Impression(s) / ED Diagnoses Final diagnoses:  None    Rx / DC Orders ED Discharge Orders    None       Cathren Laine, MD 02/14/19 [phone]

## 2019-02-13 NOTE — ED Triage Notes (Addendum)
Pt has IV fluids ordered. Pt refused IV and stated that her mom is coming to pick her up and she doesn't need any treatment. Dr. Denton Lank aware.

## 2019-02-13 NOTE — ED Notes (Signed)
Pt stated that husband was here Pt ambulatory from Triage rm 5 to ED entrance Pt got in car with husband

## 2019-02-13 NOTE — ED Triage Notes (Signed)
Pt BIBA from [facility] . Pt has been drinking all day (unknown amount). Pt went inside to get to go food and coming out pt "passed out" - pt has refried beans on clothing. Pt A&Ox2 to person and time.

## 2019-02-13 NOTE — ED Notes (Signed)
Pt left with Feliberto Gottron who she stated is her husband, Feliberto Gottron called asking for update prior and left his phone number [phone]

## 2019-02-13 NOTE — ED Notes (Signed)
Patient husband picked her up. Patient requested to leave.

## 2019-02-13 NOTE — ED Notes (Signed)
Date and time results received: 02/13/19 6:53 PM  (use smartphrase ".now" to insert current time)  Test: ETOH Critical Value: 372  Name of Provider Notified: Steinl  Orders Received? Or Actions Taken?: Orders Received - See Orders for details

## 2019-04-05 ENCOUNTER — Ambulatory Visit: Payer: Managed Care, Other (non HMO) | Attending: Internal Medicine

## 2019-04-05 DIAGNOSIS — Z23 Encounter for immunization: Secondary | ICD-10-CM

## 2019-04-05 NOTE — Progress Notes (Signed)
   Covid-19 Vaccination Clinic  Name:  Tanya Wilson    MRN: [id number] DOB: 04-19-1985  04/05/2019  [name] was observed post Covid-19 immunization for 15 minutes without incident. She was provided with Vaccine Information Sheet and instruction to access the V-Safe system.   [name] was instructed to call 911 with any severe reactions post vaccine: Marland Kitchen Difficulty breathing  . Swelling of face and throat  . A fast heartbeat  . A bad rash all over body  . Dizziness and weakness   Immunizations Administered    Name Date Dose VIS Date Route   Pfizer COVID-19 Vaccine 04/05/2019 12:31 PM 0.3 mL 01/03/2019 Intramuscular   Manufacturer: [employer], [employer]   Lot: [id number]   NDC: [address]-2

## 2019-04-28 ENCOUNTER — Ambulatory Visit: Payer: Self-pay

## 2019-04-29 ENCOUNTER — Ambulatory Visit: Payer: Managed Care, Other (non HMO) | Attending: Internal Medicine

## 2019-04-29 DIAGNOSIS — Z23 Encounter for immunization: Secondary | ICD-10-CM

## 2019-04-29 NOTE — Progress Notes (Signed)
   Covid-19 Vaccination Clinic  Name:  Mady Oubre    MRN: [id number] DOB: [birth year]  04/29/2019  Ms. Stumpe was observed post Covid-19 immunization for 15 minutes without incident. She was provided with Vaccine Information Sheet and instruction to access the V-Safe system.   Ms. Provencio was instructed to call 911 with any severe reactions post vaccine: Marland Kitchen Difficulty breathing  . Swelling of face and throat  . A fast heartbeat  . A bad rash all over body  . Dizziness and weakness   Immunizations Administered    Name Date Dose VIS Date Route   Pfizer COVID-19 Vaccine 04/29/2019  8:50 AM 0.3 mL 01/03/2019 Intramuscular   Manufacturer: [employer], [employer]   Lot: [id number]   NDC: [address]-2

## 2019-05-27 IMAGING — US US ABDOMEN LIMITED
1 series · 14 of 25 positions shown · non-contrast
Comparison: None.

CLINICAL DATA: Pancreatitis

EXAM:
ULTRASOUND ABDOMEN LIMITED RIGHT UPPER QUADRANT

[Series 1: us abdomen limited · 14 of 62 slices shown]
[im 1/62]
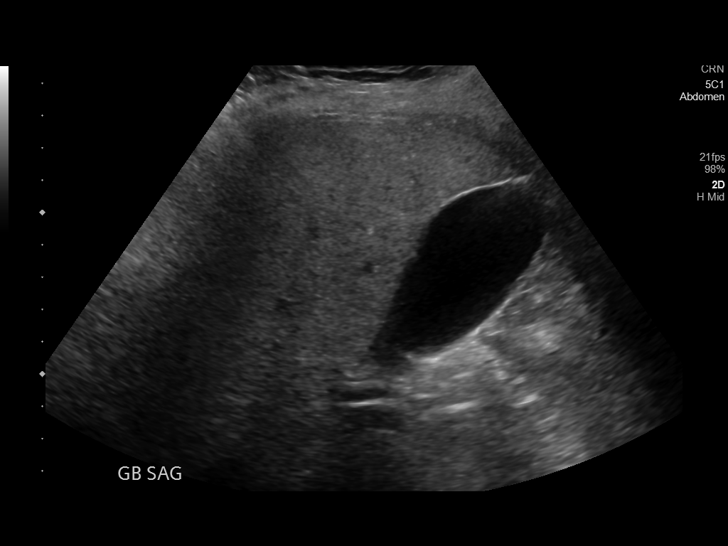
[im 6/62]
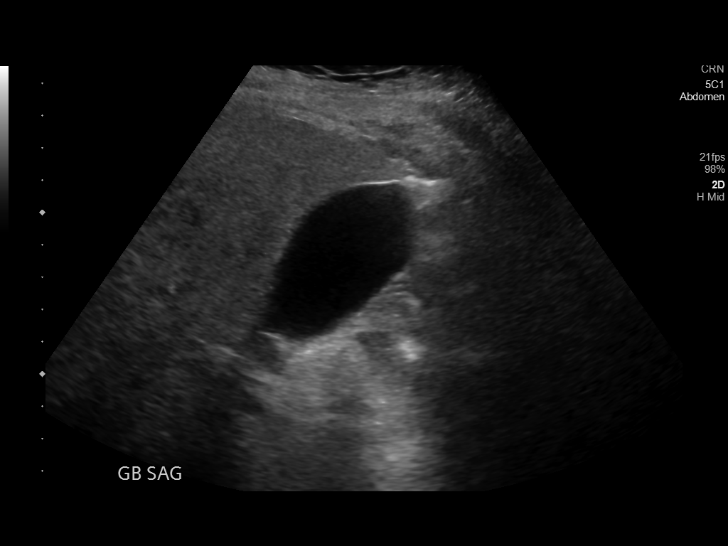
[im 11/62]
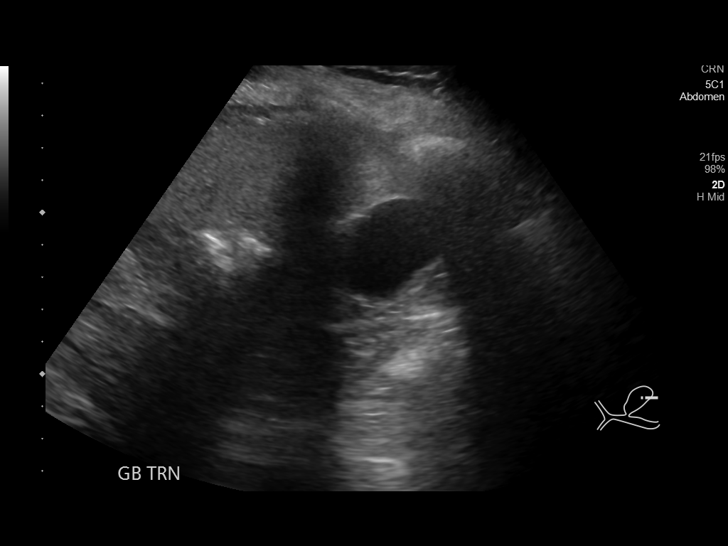
[im 16/62]
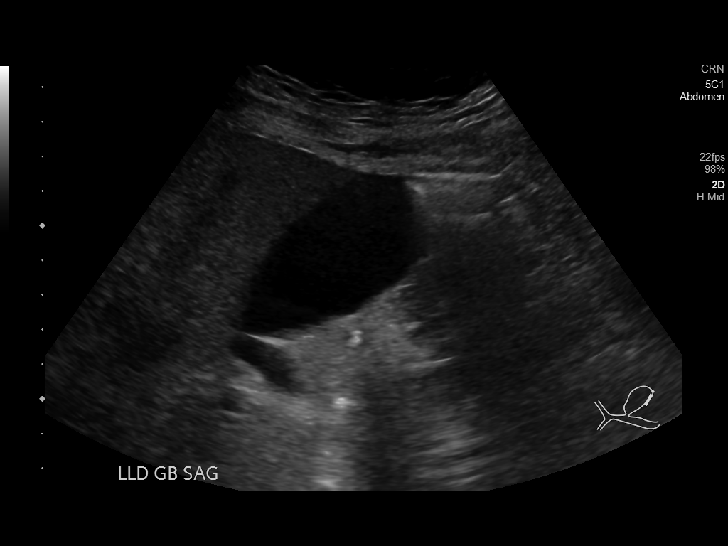
[im 21/62]
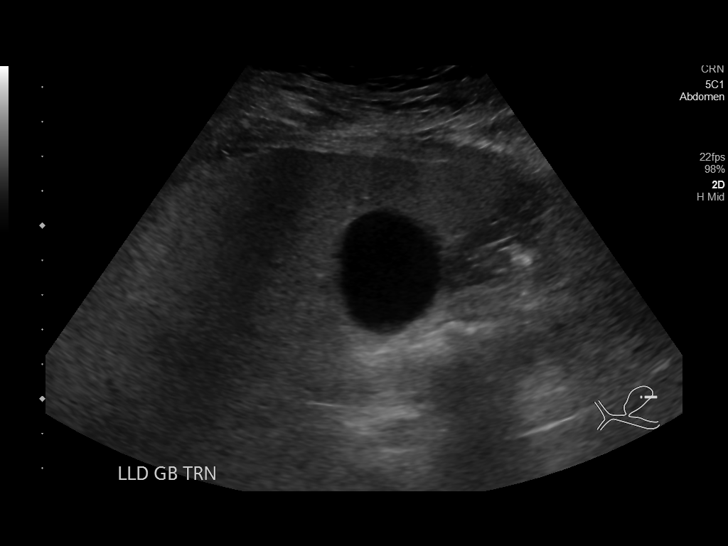
[im 23/62]
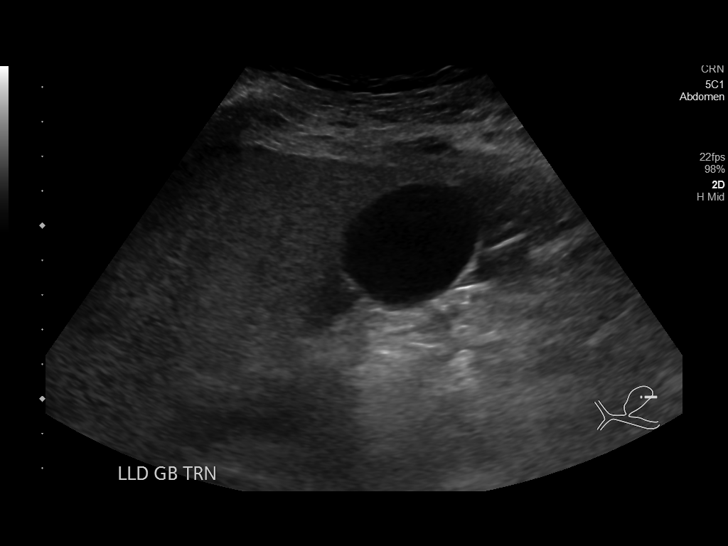
[im 28/62]
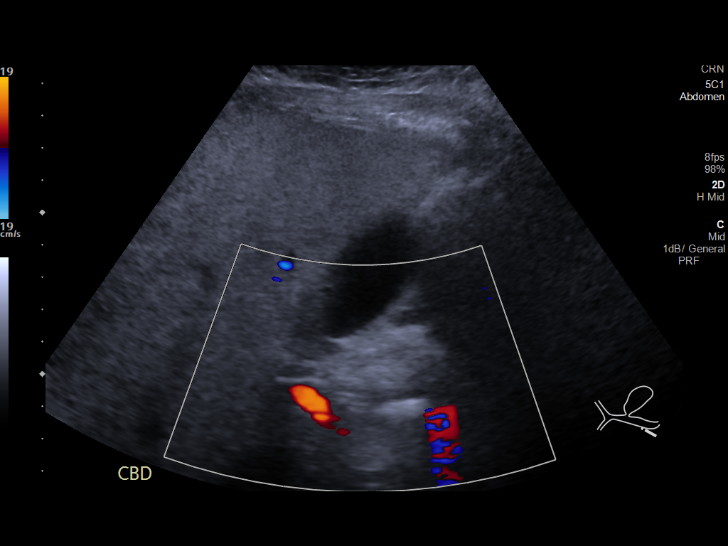
[im 34/62]
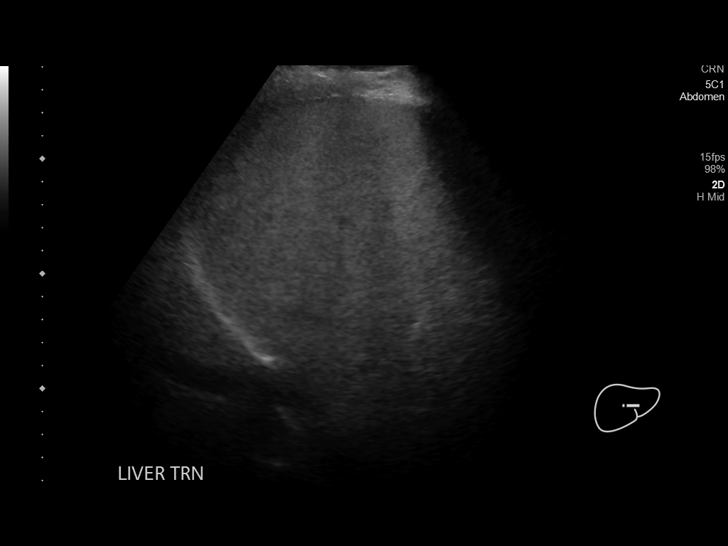
[im 39/62]
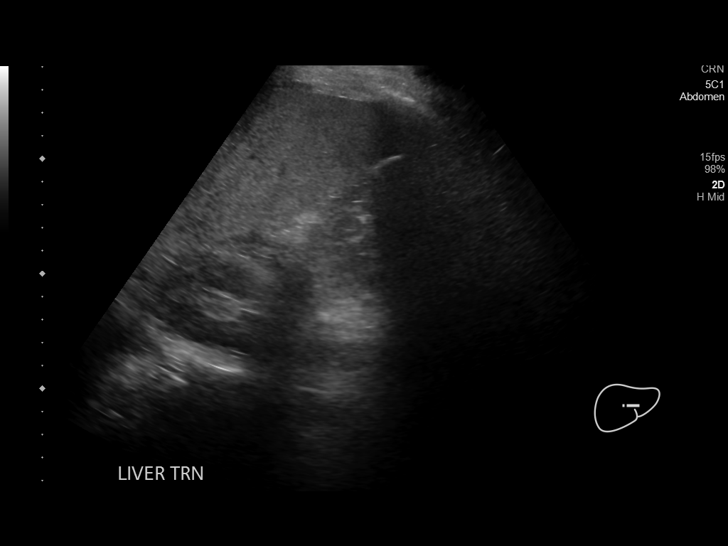
[im 41/62]
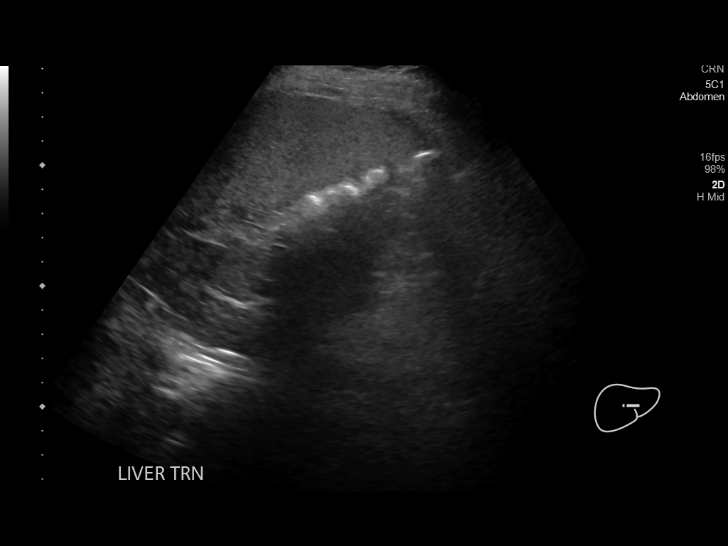
[im 46/62]
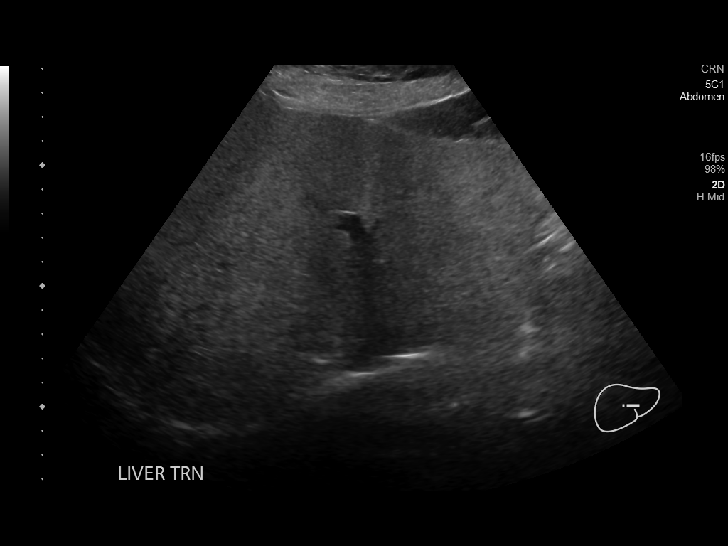
[im 51/62]
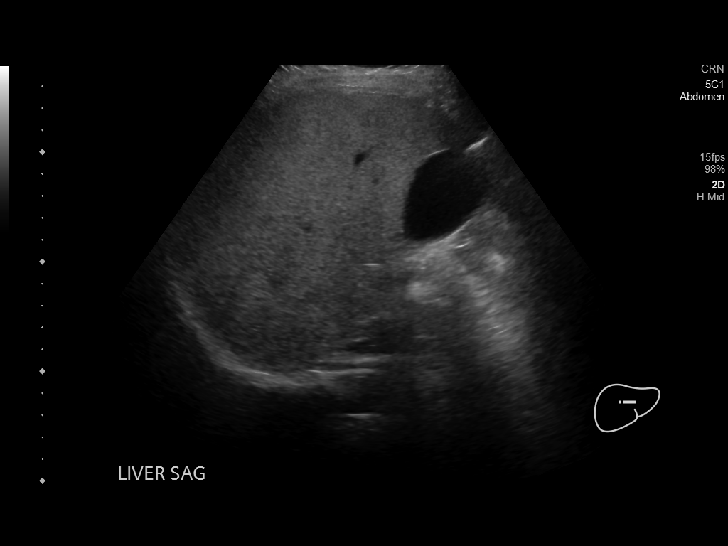
[im 56/62]
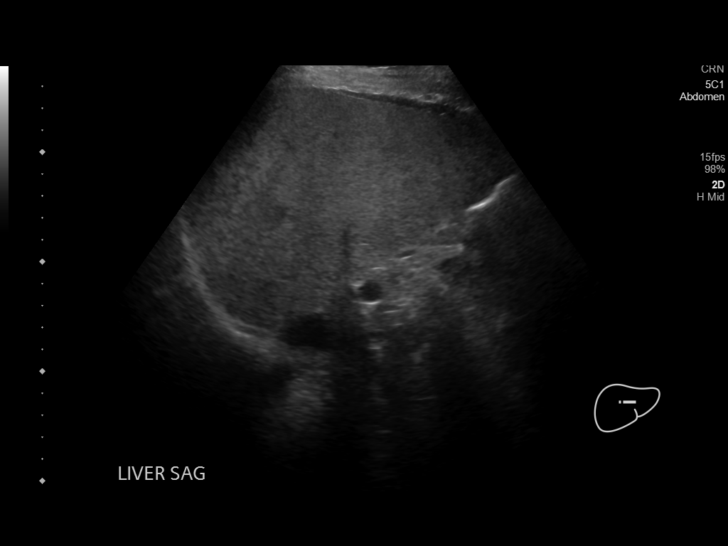
[im 62/62]
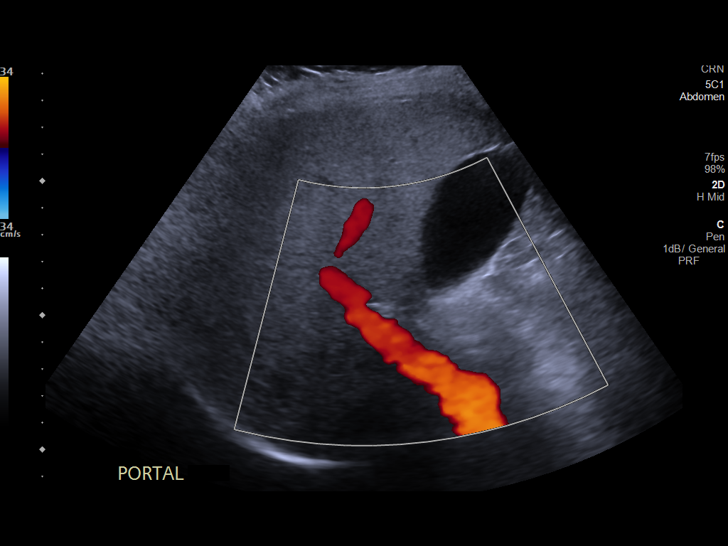

[14 of 25 positions shown; findings below may reference images not displayed]

FINDINGS: Gallbladder:

No gallstones or wall thickening visualized. No sonographic Murphy
sign noted by sonographer.

Common bile duct:

Diameter: 5 mm. There is incomplete visualization. Where visualized,
no filling defect.

Liver:

Echogenic liver with diminished acoustic penetration. No focal
abnormality. Portal vein is patent on color Doppler imaging with
normal direction of blood flow towards the liver.
IMPRESSION: 1. No evidence of biliary calculus.
2. Hepatic steatosis

## 2019-07-18 ENCOUNTER — Telehealth: Payer: Self-pay

## 2019-07-18 NOTE — Telephone Encounter (Signed)
Coralie Keens, RN from [facility] left VM on nurse line requesting our office follow up with patient. Reports pt had positive UPT on 07/03/19 and [place] completed at that time that showed gestation sac with possible faint yolk sac. Per their protocol, pt returned 2 weeks later for repeat US that showed gestation sac with no yolk sac visualized. Pt denied bleeding or cramping at both visits. Pt is expecting a call from our office for follow up care.

## 2020-08-02 ENCOUNTER — Emergency Department (HOSPITAL_COMMUNITY)
Admission: EM | Admit: 2020-08-02 | Discharge: 2020-08-02 | Disposition: A | Discharge status: Home / Self Care | Payer: No Typology Code available for payment source | Attending: Emergency Medicine | Admitting: Emergency Medicine

## 2020-08-02 ENCOUNTER — Other Ambulatory Visit: Payer: Self-pay

## 2020-08-02 ENCOUNTER — Encounter (HOSPITAL_COMMUNITY): Payer: Self-pay

## 2020-08-02 DIAGNOSIS — R000 Tachycardia, unspecified: Secondary | ICD-10-CM | POA: Diagnosis not present

## 2020-08-02 DIAGNOSIS — F17210 Nicotine dependence, cigarettes, uncomplicated: Secondary | ICD-10-CM | POA: Diagnosis not present

## 2020-08-02 DIAGNOSIS — F1910 Other psychoactive substance abuse, uncomplicated: Secondary | ICD-10-CM | POA: Insufficient documentation

## 2020-08-02 NOTE — ED Triage Notes (Signed)
Pt states was told to come here for rehab prior to court. Patient states she is suppose to go to rehab for ETOH. Patient states last drink yesterday.

## 2020-08-02 NOTE — ED Provider Notes (Signed)
[facility]-EMERGENCY DEPT Provider Note   CSN: [id number] Arrival date & time: 08/02/20  1829     History Chief Complaint  Patient presents with   rehab for etoh    Tanya Wilson is a 35 y.o. female.  HPI  Patient with significant medical history of anxiety, bipolar, EtOH abuse presents to the emergency department with chief complaint of lab work.  Patient states she called the [facility] 1 week ago requesting to be admitted to help with her alcohol abuse.  They told her that she had a come to the hospital to get her "lab work" done and they will transfer to the facility.  She states that she is not going through alcohol withdrawal at this moment, states her last drink was yesterday, states she does not drink very much, she denies anxiety, chest pain, shortness of breath, abdominal pain, nausea, vomiting, she denies suicidal homicidal ideations, denies hallucinations or delusions.  She states that she has no complaints this time.  She simply just wants to check her self into a alcohol rehab center.  After reviewing patient's chart I do not see any records indicating that the patient should come to the emergency department for her labs checked.   Past Medical History:  Diagnosis Date   Anxiety    Bipolar affective (HCC)    ETOH abuse    Reflux     Patient Active Problem List   Diagnosis Date Noted   Acute pancreatitis 07/17/2017   Elevated liver enzymes 07/17/2017   Alcohol abuse, continuous 09/26/2011    Class: Acute   Bipolar affective disorder, current episode hypomanic (HCC) 09/26/2011    Class: Chronic    History reviewed. No pertinent surgical history.   OB History   No obstetric history on file.     History reviewed. No pertinent family history.  Social History   Tobacco Use   Smoking status: Some Days    Pack years: 0.00    Types: Cigarettes   Smokeless tobacco: Never  Substance Use Topics    Alcohol use: Yes    Alcohol/week: 3.0 standard drinks    Types: 3 Cans of beer per week    Comment: last drink 5 days ago   Drug use: Not Currently    Types: Marijuana    Home Medications Prior to Admission medications   Medication Sig Start Date End Date Taking? Authorizing Provider  calcium carbonate (TUMS - DOSED IN MG ELEMENTAL CALCIUM) 500 MG chewable tablet Chew 1 tablet (200 mg of elemental calcium total) by mouth 3 (three) times daily with meals as needed for indigestion or heartburn. 07/20/17   Narda Bonds, MD  folic acid (FOLVITE) 1 MG tablet Take 1 tablet (1 mg total) by mouth daily. 07/21/17   Narda Bonds, MD  ibuprofen (ADVIL,MOTRIN) 200 MG tablet Take 200 mg by mouth every 6 (six) hours as needed for moderate pain.    [provider]  metroNIDAZOLE (METROGEL) 0.75 % gel Apply topically 2 (two) times daily. 07/20/17   Narda Bonds, MD  Multiple Vitamin (MULTIVITAMIN WITH MINERALS) TABS tablet Take 1 tablet by mouth daily. 07/21/17   Narda Bonds, MD  [name] 3-0.02 MG tablet Take 1 tablet by mouth daily.  07/05/17   [provider]  ondansetron (ZOFRAN) 4 MG tablet Take 1 tablet (4 mg total) by mouth every 6 (six) hours as needed for nausea. 07/20/17   Narda Bonds, MD  thiamine 100  MG tablet Take 1 tablet (100 mg total) by mouth daily. 07/21/17   Narda Bonds, MD    Allergies    Penicillins  Review of Systems   Review of Systems  Constitutional:  Negative for chills and fever.  HENT:  Negative for congestion.   Respiratory:  Negative for shortness of breath.   Cardiovascular:  Negative for chest pain.  Gastrointestinal:  Negative for abdominal pain.  Genitourinary:  Negative for enuresis.  Musculoskeletal:  Negative for back pain.  Skin:  Negative for rash.  Neurological:  Negative for dizziness.  Hematological:  Does not bruise/bleed easily.   Physical Exam Updated Vital Signs BP (!) 115/95   Pulse (!) 106   Temp 98 F (36.7 C)    Resp 20   Ht 5\' 4"  (1.626 m)   Wt 68 kg   SpO2 99%   BMI 25.73 kg/m   Physical Exam Vitals and nursing note reviewed.  Constitutional:      General: She is not in acute distress.    Appearance: Normal appearance. She is not ill-appearing or diaphoretic.  HENT:     Head: Normocephalic and atraumatic.     Nose: No congestion or rhinorrhea.  Eyes:     General:        Right eye: No discharge.        Left eye: No discharge.     Conjunctiva/sclera: Conjunctivae normal.  Cardiovascular:     Rate and Rhythm: Regular rhythm. Tachycardia present.  Pulmonary:     Effort: Pulmonary effort is normal. No respiratory distress.     Breath sounds: Normal breath sounds. No wheezing.  Musculoskeletal:     Cervical back: Neck supple.     Right lower leg: No edema.     Left lower leg: No edema.     Comments: There is no noted tremors on my exam.  Skin:    General: Skin is warm and dry.     Coloration: Skin is not jaundiced or pale.  Neurological:     Mental Status: She is alert and oriented to person, place, and time.  Psychiatric:        Mood and Affect: Mood normal.     Comments: Patient was not anxious on examination, was not responding responding to internal stimuli.    ED Results / Procedures / Treatments   Labs (all labs ordered are listed, but only abnormal results are displayed) Labs Reviewed - No data to display  EKG None  Radiology No results found.  Procedures Procedures   Medications Ordered in ED Medications - No data to display  ED Course  I have reviewed the triage vital signs and the nursing notes.  Pertinent labs & imaging results that were available during my care of the patient were reviewed by me and considered in my medical decision making (see chart for details).    MDM Rules/Calculators/A&P                         Initial impression-patient presents seeking help to get into a rehab facility.  She is alert, does not appear acute chest, vital signs  show tachycardia.  Work-up-due to well-appearing patient, benign for exam, further lab or imaging ordered at this time  Rule out-I have low suspicion for psychiatric emergency as patient denies hallucinations, delusions, denies suicidal homicidal ideations, does not appear to be responding to internal stimuli responding appropriate to all questions during my exam.  Low suspicion  for alcohol withdrawal as she does not appear anxious on my exam, no noted tremors, she denies nausea, vomiting, becoming diaphoretic.  She is noted to be slightly tachycardic my exam but she states she is nervous being here, after reviewing patient's chart it is noted that hr in generally in the upper 90s to low 100s.  Plan-  EtOH abuse- will provide patient with information for outpatient counseling for EtOH abuse, also provided with information for the behavioral health urgent care for further evaluation.  Vital signs have remained stable, no indication for hospital admission.  Patient given at home care as well strict return precautions.  Patient verbalized that they understood agreed to said plan.    Final Clinical Impression(s) / ED Diagnoses Final diagnoses:  Substance abuse [facility])    Rx / DC Orders ED Discharge Orders     None        Carroll Sage, PA-C 08/02/20 2015    Terald Sleeper, MD 08/03/20 [phone]

## 2020-08-02 NOTE — Discharge Instructions (Addendum)
Given you information for outpatient alcohol use disorder rehab please read  You also follow-up at the behavioral health urgent care for further evaluation.  Come back to the emergency department if you develop chest pain, shortness of breath, severe abdominal pain, uncontrolled nausea, vomiting, diarrhea.

## 2020-09-13 ENCOUNTER — Other Ambulatory Visit: Payer: Self-pay

## 2020-09-13 ENCOUNTER — Other Ambulatory Visit (HOSPITAL_COMMUNITY)
Admission: EM | Admit: 2020-09-13 | Discharge: 2020-09-18 | Disposition: A | Discharge status: Home / Self Care | Payer: No Typology Code available for payment source | Attending: Psychiatry | Admitting: Psychiatry

## 2020-09-13 DIAGNOSIS — Z20822 Contact with and (suspected) exposure to covid-19: Secondary | ICD-10-CM | POA: Diagnosis not present

## 2020-09-13 DIAGNOSIS — Z7984 Long term (current) use of oral hypoglycemic drugs: Secondary | ICD-10-CM | POA: Diagnosis not present

## 2020-09-13 DIAGNOSIS — F1024 Alcohol dependence with alcohol-induced mood disorder: Secondary | ICD-10-CM | POA: Diagnosis present

## 2020-09-13 DIAGNOSIS — F1010 Alcohol abuse, uncomplicated: Secondary | ICD-10-CM | POA: Diagnosis not present

## 2020-09-13 DIAGNOSIS — E119 Type 2 diabetes mellitus without complications: Secondary | ICD-10-CM | POA: Insufficient documentation

## 2020-09-13 DIAGNOSIS — Z79899 Other long term (current) drug therapy: Secondary | ICD-10-CM | POA: Insufficient documentation

## 2020-09-13 DIAGNOSIS — F1290 Cannabis use, unspecified, uncomplicated: Secondary | ICD-10-CM | POA: Diagnosis not present

## 2020-09-13 DIAGNOSIS — Z9151 Personal history of suicidal behavior: Secondary | ICD-10-CM | POA: Diagnosis not present

## 2020-09-13 DIAGNOSIS — R634 Abnormal weight loss: Secondary | ICD-10-CM | POA: Insufficient documentation

## 2020-09-13 LAB — COMPREHENSIVE METABOLIC PANEL
ALT: 24 U/L (ref 0–44)
AST: 36 U/L (ref 15–41)
Albumin: 4.1 g/dL (ref 3.5–5.0)
Alkaline Phosphatase: 78 U/L (ref 38–126)
Anion gap: 16 — ABNORMAL HIGH (ref 5–15)
BUN: 12 mg/dL (ref 6–20)
CO2: 22 mmol/L (ref 22–32)
Calcium: 9.3 mg/dL (ref 8.9–10.3)
Chloride: 98 mmol/L (ref 98–111)
Creatinine, Ser: 0.7 mg/dL (ref 0.44–1.00)
GFR, Estimated: >60 mL/min (ref >60)
Glucose, Bld: 237 mg/dL — ABNORMAL HIGH (ref 70–99)
Potassium: 3.8 mmol/L (ref 3.5–5.1)
Sodium: 136 mmol/L (ref 135–145)
Total Bilirubin: 0.4 mg/dL (ref 0.3–1.2)
Total Protein: 7.3 g/dL (ref 6.5–8.1)

## 2020-09-13 LAB — URINALYSIS, ROUTINE W REFLEX MICROSCOPIC
Bacteria, UA: NONE SEEN
Bilirubin Urine: NEGATIVE
Glucose, UA: >=500 mg/dL — AB
Hgb urine dipstick: NEGATIVE
Ketones, ur: NEGATIVE mg/dL
Leukocytes,Ua: NEGATIVE
Nitrite: NEGATIVE
Protein, ur: NEGATIVE mg/dL
Specific Gravity, Urine: 1.01 (ref 1.005–1.030)
pH: 6 (ref 5.0–8.0)

## 2020-09-13 LAB — CBC WITH DIFFERENTIAL/PLATELET
Abs Immature Granulocytes: 0.04 10*3/uL (ref 0.00–0.07)
Basophils Absolute: 0 10*3/uL (ref 0.0–0.1)
Basophils Relative: 1 %
Eosinophils Absolute: 0.1 10*3/uL (ref 0.0–0.5)
Eosinophils Relative: 1 %
HCT: 44.2 % (ref 36.0–46.0)
Hemoglobin: 15.5 g/dL — ABNORMAL HIGH (ref 12.0–15.0)
Immature Granulocytes: 1 %
Lymphocytes Relative: 14 %
Lymphs Abs: 1 10*3/uL (ref 0.7–4.0)
MCH: 34.3 pg — ABNORMAL HIGH (ref 26.0–34.0)
MCHC: 35.1 g/dL (ref 30.0–36.0)
MCV: 97.8 fL (ref 80.0–100.0)
Monocytes Absolute: 1.1 10*3/uL — ABNORMAL HIGH (ref 0.1–1.0)
Monocytes Relative: 17 %
Neutro Abs: 4.4 10*3/uL (ref 1.7–7.7)
Neutrophils Relative %: 66 %
Platelets: 308 10*3/uL (ref 150–400)
RBC: 4.52 MIL/uL (ref 3.87–5.11)
RDW: 13.2 % (ref 11.5–15.5)
WBC: 6.7 10*3/uL (ref 4.0–10.5)
nRBC: 0 % (ref 0.0–0.2)

## 2020-09-13 LAB — LIPID PANEL
Cholesterol: 235 mg/dL — ABNORMAL HIGH (ref 0–200)
HDL: 79 mg/dL (ref >40)
LDL Cholesterol: 98 mg/dL (ref 0–99)
Total CHOL/HDL Ratio: 3 RATIO
Triglycerides: 289 mg/dL — ABNORMAL HIGH (ref <150)
VLDL: 58 mg/dL — ABNORMAL HIGH (ref 0–40)

## 2020-09-13 LAB — POCT URINE DRUG SCREEN - MANUAL ENTRY (I-SCREEN)
POC Amphetamine UR: NOT DETECTED
POC Buprenorphine (BUP): NOT DETECTED
POC Cocaine UR: NOT DETECTED
POC Marijuana UR: POSITIVE — AB
POC Methadone UR: NOT DETECTED
POC Methamphetamine UR: NOT DETECTED
POC Morphine: NOT DETECTED
POC Oxazepam (BZO): NOT DETECTED
POC Oxycodone UR: NOT DETECTED
POC Secobarbital (BAR): NOT DETECTED

## 2020-09-13 LAB — ETHANOL: Alcohol, Ethyl (B): 197 mg/dL — ABNORMAL HIGH (ref <10)

## 2020-09-13 LAB — RESP PANEL BY RT-PCR (FLU A&B, COVID) ARPGX2
Influenza A by PCR: NEGATIVE
Influenza B by PCR: NEGATIVE
SARS Coronavirus 2 by RT PCR: NEGATIVE

## 2020-09-13 LAB — HEMOGLOBIN A1C
Hgb A1c MFr Bld: 9.6 % — ABNORMAL HIGH (ref 4.8–5.6)
Mean Plasma Glucose: 228.82 mg/dL

## 2020-09-13 LAB — POC SARS CORONAVIRUS 2 AG: SARSCOV2ONAVIRUS 2 AG: NEGATIVE

## 2020-09-13 LAB — PREGNANCY, URINE: Preg Test, Ur: NEGATIVE

## 2020-09-13 LAB — POCT PREGNANCY, URINE: Preg Test, Ur: NEGATIVE

## 2020-09-13 LAB — TSH: TSH: 2.25 u[IU]/mL (ref 0.350–4.500)

## 2020-09-13 LAB — POC SARS CORONAVIRUS 2 AG -  ED: SARS Coronavirus 2 Ag: NEGATIVE

## 2020-09-13 MED ORDER — MAGNESIUM HYDROXIDE 400 MG/5ML PO SUSP
30.0000 mL | Freq: Every day | ORAL | Status: DC | PRN
  Administered 2020-09-17: 30 mL via ORAL
  Filled 2020-09-13: qty 30

## 2020-09-13 MED ORDER — TRAZODONE HCL 50 MG PO TABS
50.0000 mg | ORAL_TABLET | Freq: Every evening | ORAL | Status: DC | PRN
  Administered 2020-09-14 – 2020-09-17 (×4): 50 mg via ORAL
  Filled 2020-09-13 (×4): qty 1

## 2020-09-13 MED ORDER — ONDANSETRON 4 MG PO TBDP
4.0000 mg | ORAL_TABLET | Freq: Four times a day (QID) | ORAL | Status: AC | PRN
Start: 2020-09-13 — End: 2020-09-16
  Administered 2020-09-14 – 2020-09-16 (×3): 4 mg via ORAL
  Filled 2020-09-13 (×3): qty 1

## 2020-09-13 MED ORDER — ARIPIPRAZOLE 2 MG PO TABS
2.0000 mg | ORAL_TABLET | Freq: Every day | ORAL | Status: DC
  Administered 2020-09-14 – 2020-09-18 (×5): 2 mg via ORAL
  Filled 2020-09-13 (×5): qty 1

## 2020-09-13 MED ORDER — THIAMINE HCL 100 MG PO TABS
100.0000 mg | ORAL_TABLET | Freq: Every day | ORAL | Status: DC
  Administered 2020-09-14 – 2020-09-18 (×5): 100 mg via ORAL
  Filled 2020-09-13 (×5): qty 1

## 2020-09-13 MED ORDER — ACETAMINOPHEN 325 MG PO TABS
650.0000 mg | ORAL_TABLET | Freq: Four times a day (QID) | ORAL | Status: DC | PRN
  Administered 2020-09-15: 650 mg via ORAL
  Filled 2020-09-13: qty 2

## 2020-09-13 MED ORDER — LORAZEPAM 1 MG PO TABS: 1.0000 mg | ORAL_TABLET | Freq: Four times a day (QID) | ORAL | Status: AC | PRN

## 2020-09-13 MED ORDER — LOPERAMIDE HCL 2 MG PO CAPS: 2.0000 mg | ORAL_CAPSULE | ORAL | Status: AC | PRN

## 2020-09-13 MED ORDER — BUSPIRONE HCL 5 MG PO TABS
5.0000 mg | ORAL_TABLET | Freq: Three times a day (TID) | ORAL | Status: DC
  Administered 2020-09-13 – 2020-09-18 (×14): 5 mg via ORAL
  Filled 2020-09-13 (×14): qty 1

## 2020-09-13 MED ORDER — HYDROXYZINE HCL 25 MG PO TABS
25.0000 mg | ORAL_TABLET | Freq: Four times a day (QID) | ORAL | Status: AC | PRN
  Administered 2020-09-15: 25 mg via ORAL
  Filled 2020-09-13 (×2): qty 1

## 2020-09-13 MED ORDER — ALUM & MAG HYDROXIDE-SIMETH 200-200-20 MG/5ML PO SUSP: 30.0000 mL | ORAL | Status: DC | PRN

## 2020-09-13 MED ORDER — METFORMIN HCL 500 MG PO TABS
500.0000 mg | ORAL_TABLET | Freq: Two times a day (BID) | ORAL | Status: DC
  Administered 2020-09-14 – 2020-09-18 (×9): 500 mg via ORAL
  Filled 2020-09-13 (×9): qty 1

## 2020-09-13 MED ORDER — ADULT MULTIVITAMIN W/MINERALS CH
1.0000 | ORAL_TABLET | Freq: Every day | ORAL | Status: DC
  Administered 2020-09-13 – 2020-09-18 (×6): 1 via ORAL
  Filled 2020-09-13 (×6): qty 1
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
## 2020-09-13 NOTE — ED Provider Notes (Signed)
[facility] Admission Suicide Risk Assessment   Nursing information obtained from:  chart   Demographic factors:   caucasian Current Mental Status:   see admission H&P Loss Factors:   miscarriage 06/2019, no out patient providers, fear of jail time Historical Factors:   impusivity, alcohol abuse, hx of depression and Anxiety, previous suicide attempt x 2. Risk Reduction Factors:   obligation to family, states she is "spiritual", positive support from partner.  Total Time spent with patient: 45 minutes Principal Problem: <principal problem not specified> Diagnosis:  Active Problems:   Alcohol dependence with alcohol-induced mood disorder (HCC)  Subjective Data:  Tanya Wilson is a 35 y.o. female. patient who initially presented to [facility] as a walk in alone with complaints of "I need help with my depression and alcoholism". She was assessed by this Clinical research associate and agreed to be admitted to the [facility] unit for crisis stabilization.    Tanya Wilson, 35 y.o., female patient seen face to face by this provider, consulted with Dr. Bronwen Betters; and chart reviewed on 09/13/20.  On evaluation Tanya Wilson reports she has a long history of alcohol abuse.  She has two DUI's she received in 2021, states her court date for those DUIs are 09/24/2020.  States both are related to alcohol use.  She also had a miscarriage in 06/2019.  States over the past month her depression and anxiety has increased.  States, "if I do not get some help I am going to go to jail".  Reports a psychiatric history of alcohol use, anxiety, depression, and bipolar disorder.   During evaluation Tanya Wilson is sitting position in no acute distress.  She is well-groomed and makes good eye contact.  She is alert/oriented x 4; cooperative.states that she is anxious to and depressed with congruent with affect.  She is speaking in a clear tone at moderate volume, and fast pace.  States she only sleeps 3 hours per night.  Has decreased appetite with  a 20 pound weight loss over the past month.  Reports that she isolates at times and is quick to get irritable.  Her thought process is coherent and relevant; There is no indication that she is currently responding to internal/external stimuli or experiencing delusional thought content; and she has denied  self-harm/homicidal ideation, psychosis, and paranoia.  Patient endorses suicidal ideations.reports she has nonstop suicidal thoughts.  Denies any and intent, specific plan, or access to means.  Patient cannot contract for safety at this time.  States, "I just do not care anymore".  States that she had one suicide attempt last month when she was drinking she took 10 pills, she did not seek medical attention.  States she had one additional attempt years ago, also attempted overdose.  States she has had one inpatient psychiatric admission in  09/2011 at [facility].  Denies auditory and visual hallucinations.   States she drinks two 4 Loco's a day and at times she drinks until she is drunk.  States her last drink was at 4:00 PM today, 2 hours ago. States she has never had a seizure when she stops drinking.  States she has been clean for as long as 4 months in the past.  Reports the only time she had a seizure was from medication that she had taken, not from alcohol use or withdrawal.  Denies any withdrawal symptoms at this time.  Endorses marijuana use denies any other illegal substance.  States she lives with her partner and her 52 year old son.  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      Reports she is type II diabetic.  States her current medications are BuSpar 5 mg p.o. 3 times daily, Zofran 4 mg p.o. every 8 hours as needed, Glyxambi 10-mg-5mg  PO QD, metformin 500 mg p.o. twice daily.  Patient's outpatient PCP is Dr. Everlene Other Patient answered questions appropriately.     Patient agreed to be [facility] the [facility] crisis [facility].  Continued Clinical Symptoms:    The "Alcohol Use  Disorders Identification Test", Guidelines for Use in Primary Care, Second Edition.  World Science writer [facility]). Score between 0-7:  no or low risk or alcohol related problems. Score between 8-15:  moderate risk of alcohol related problems. Score between 16-19:  high risk of alcohol related problems. Score 20 or above:  warrants further diagnostic evaluation for alcohol dependence and treatment.   CLINICAL FACTORS:   Severe Anxiety and/or Agitation Bipolar Disorder:   Depressive phase Depression:   Comorbid alcohol abuse/dependence Impulsivity Alcohol/Substance Abuse/Dependencies   Musculoskeletal: Strength & Muscle Tone: within normal limits Gait & Station: normal Patient leans: N/A  Psychiatric Specialty Exam:  Presentation  General Appearance: Appropriate for Environment; Casual  Eye Contact:Good  Speech:Clear and Coherent  Speech Volume:Normal  Handedness:Right   Mood and Affect  Mood:Anxious; Depressed  Affect:Congruent   Thought Process  Thought Processes:Coherent  Descriptions of Associations:Intact  Orientation:Full (Time, Place and Person)  Thought Content:Logical  History of Schizophrenia/Schizoaffective disorder:No  Duration of Psychotic Symptoms:No data recorded Hallucinations:Hallucinations: None  Ideas of Reference:None  Suicidal Thoughts:Suicidal Thoughts: Yes, Passive SI Passive Intent and/or Plan: Without Intent; Without Plan; Without Means to Carry Out  Homicidal Thoughts:Homicidal Thoughts: No   Sensorium  Memory:Immediate Good; Recent Good; Remote Good  Judgment:Fair  Insight:Good   Executive Functions  Concentration:Good  Attention Span:Good  Recall:Good  Fund of Knowledge:Good  Language:Good   Psychomotor Activity  Psychomotor Activity:Psychomotor Activity: Normal   Assets  Assets:Communication Skills; Desire for Improvement; Financial Resources/Insurance; Housing; Leisure Time; Physical Health;  Vocational/Educational; Resilience; Social Support; Talents/Skills; Transportation; Intimacy   Sleep  Sleep:Sleep: Fair Number of Hours of Sleep: 3    Physical Exam: Physical Exam see admission H&P ROS See admission H&P Blood pressure 123/83, pulse (!) 123, temperature 98.6 F (37 C), temperature source Oral, resp. rate 16, SpO2 98 %. There is no height or weight on file to calculate BMI.   COGNITIVE FEATURES THAT CONTRIBUTE TO RISK:  None    SUICIDE RISK:   Moderate:  Frequent suicidal ideation with limited intensity, and duration, some specificity in terms of plans, no associated intent, good self-control, limited dysphoria/symptomatology, some risk factors present, and identifiable protective factors, including available and accessible social support.  PLAN OF CARE:  Patient can not contract for safety and is presenting with SI. Patient will be admitted to the [facility]   Home medications were restarted: Metformin 500 mg p.o. twice daily BuSpar 5 mg p.o. 3 times daily.  Discussed Abilify with patient for mood stabilization, 2 mg p.o. daily will be started on 09/14/2020.  Glyxambi 10 mg -5mg  po qd for DM was not listed- will consult pharmacy.    Lab work and EKG ordered while in the .  Lab work and EKG have not resulted at this time.  UDS positive for THC.  I certify that inpatient services furnished can reasonably be expected to improve the patient's condition.   [employer], NP 09/13/2020, 8:39 PM

## 2020-09-13 NOTE — ED Provider Notes (Signed)
Behavioral Health Urgent Care Medical Screening Exam  Patient Name: Tanya Wilson MRN: [id number] Date of Evaluation: 09/13/20 Chief Complaint:   Diagnosis:  Final diagnoses:  Alcohol abuse, continuous  Alcohol dependence with alcohol-induced mood disorder (HCC)    History of Present illness: Tanya Wilson is a 35 y.o. female. patient presented to [facility] as a walk in alone with complaints of "I need help with my depression and alcoholism".  Tanya Wilson, 35 y.o., female patient seen face to face by this provider, consulted with Dr. Bronwen Betters; and chart reviewed on 09/13/20.  On evaluation Tanya Wilson reports she has a long history of alcohol abuse.  She has two DUI's she received in 2021, states her court date for those DUIs are 09/24/2020.  States both are related to alcohol use.  She also had a miscarriage in 06/2019.  States over the past month her depression and anxiety has increased.  States, "if I do not get some help I am going to go to jail".  Reports a psychiatric history of alcohol use, anxiety, depression, and bipolar disorder.  During evaluation Tanya Wilson is sitting position in no acute distress.  She is well-groomed and makes good eye contact.  She is alert/oriented x 4; cooperative.states that she is anxious to and depressed with congruent with affect.  She is speaking in a clear tone at moderate volume, and fast pace.  States she only sleeps 3 hours per night.  Has decreased appetite with a 20 pound weight loss over the past month.  Reports that she isolates at times and is quick to get irritable.  Her thought process is coherent and relevant; There is no indication that she is currently responding to internal/external stimuli or experiencing delusional thought content; and she has denied  self-harm/homicidal ideation, psychosis, and paranoia.  Patient endorses suicidal ideations.reports she has nonstop suicidal thoughts.  Denies any and intent, specific plan, or  access to means.  Patient cannot contract for safety at this time.  States, "I just do not care anymore".  States that she had one suicide attempt last month when she was drinking she took 10 pills, she did not seek medical attention.  States she had one additional attempt years ago, also attempted overdose.  States she has had one inpatient psychiatric admission in  09/2011 at [facility].  Denies auditory and visual hallucinations.  States she drinks two 4 Loco's a day and at times she drinks until she is drunk.  States her last drink was at 4:00 PM today, 2 hours ago. States she has never had a seizure when she stops drinking.  States she has been clean for as long as 4 months in the past.  Reports the only time she had a seizure == was from medication that she had taken not from alcohol use or withdrawal.  Denies any withdrawal symptoms at this time.  Endorses marijuana use denies any other illegal substance.  States she lives with her partner and her 45 year old son.  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     Reports she is type II diabetic.  States her current medications are BuSpar 5 mg p.o. 3 times daily, Zofran 4 mg p.o. every 8 hours as needed, Glyxambi 10-mg-5mg  PO QD, metformin 500 mg p.o. twice daily.  Patient's outpatient PCP is Dr. Everlene Other Patient answered questions appropriately.    Patient agreed to be [facility] to the [facility] crisis [facility].   Psychiatric Specialty Exam  Presentation  General Appearance:Appropriate for Environment; Casual  Eye Contact:Good  Speech:Clear and Coherent  Speech Volume:Normal  Handedness:Right   Mood and Affect  Mood:Anxious; Depressed  Affect:Congruent   Thought Process  Thought Processes:Coherent  Descriptions of Associations:Intact  Orientation:Full (Time, Place and Person)  Thought Content:Logical    Hallucinations:None  Ideas of Reference:None  Suicidal Thoughts:Yes, Passive Without Intent; Without Plan;  Without Means to Carry Out  Homicidal Thoughts:No   Sensorium  Memory:Immediate Good; Recent Good; Remote Good  Judgment:Fair  Insight:Good   Executive Functions  Concentration:Good  Attention Span:Good  Recall:Good  Fund of Knowledge:Good  Language:Good   Psychomotor Activity  Psychomotor Activity:Normal   Assets  Assets:Communication Skills; Desire for Improvement; Financial Resources/Insurance; Housing; Leisure Time; Physical Health; Vocational/Educational; Resilience; Social Support; Talents/Skills; Transportation; Intimacy   Sleep  Sleep:Fair  Number of hours: 3   Nutritional Assessment (For OBS and FBC admissions only) Has the patient had a weight loss or gain of 10 pounds or more in the last 3 months?: Yes Has the patient had a decrease in food intake/or appetite?: Yes Does the patient have dental problems?: No Does the patient have eating habits or behaviors that may be indicators of an eating disorder including binging or inducing vomiting?: No Has the patient recently lost weight without trying?: Yes, 14-23 lbs. Has the patient been eating poorly because of a decreased appetite?: Yes Malnutrition Screening Tool Score: 3 Nutritional Assessment Referrals: Medication/Tx changes   Physical Exam: Physical Exam Vitals and nursing note reviewed.  Constitutional:      General: She is not in acute distress.    Appearance: Normal appearance. She is not ill-appearing.  HENT:     Head: Normocephalic.  Eyes:     General:        Right eye: No discharge.        Left eye: No discharge.     Conjunctiva/sclera: Conjunctivae normal.  Cardiovascular:     Rate and Rhythm: Normal rate.  Pulmonary:     Effort: Pulmonary effort is normal. No respiratory distress.  Musculoskeletal:        General: Normal range of motion.     Cervical back: Normal range of motion.  Skin:    General: Skin is warm.     Coloration: Skin is not jaundiced or pale.  Neurological:      Mental Status: She is alert and oriented to person, place, and time.  Psychiatric:        Attention and Perception: Attention and perception normal.        Mood and Affect: Mood is anxious and depressed.        Speech: Speech normal.        Behavior: Behavior normal. Behavior is cooperative.        Thought Content: Thought content includes suicidal ideation. Thought content does not include suicidal plan.        Cognition and Memory: Cognition normal.        Judgment: Judgment is impulsive.   Review of Systems  Constitutional: Negative.  Negative for chills and fever.  HENT: Negative.    Eyes: Negative.   Respiratory: Negative.  Negative for cough.   Cardiovascular: Negative.  Negative for chest pain.  Musculoskeletal: Negative.   Neurological: Negative.   Psychiatric/Behavioral:  Positive for depression and suicidal ideas. The patient is nervous/anxious.   Blood pressure 123/83, pulse (!) 123, temperature 98.6 F (37 C), temperature source Oral, resp. rate 16, SpO2 98 %. There is no height or weight on  file to calculate BMI.  Musculoskeletal: Strength & Muscle Tone: within normal limits Gait & Station: normal Patient leans: N/A   [facility] MSE Discharge Disposition for Follow up and Recommendations: Based on my evaluation I certify that psychiatric inpatient services furnished can reasonably be expected to improve the patient's condition which I recommend transfer to an appropriate accepting facility.   Patient will be admitted to the [facility] unit for crisis stabilization and management.  Lab work ordered: CBC with differential, CMP, A1c, TSH, lipid panel, UDS, U/A, pregnancy test, ethanol level and EKG  Ardis Hughs, NP 09/13/2020, 6:50 PM
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
## 2020-09-13 NOTE — ED Provider Notes (Addendum)
Behavioral Health Admission H&P [facility] & OBS)  Date: 09/13/20 Patient Name: Tanya Wilson MRN: [id number] Chief Complaint:  Chief Complaint  Patient presents with   Urgent Emergent Eval      Diagnoses:  Final diagnoses:  Alcohol abuse, continuous  Alcohol dependence with alcohol-induced mood disorder [facility])    HPI:  Tanya Wilson is a 35 y.o. female. patient who initially presented to [facility] as a walk in alone with complaints of "I need help with my depression and alcoholism". She was assessed by this Clinical research associate and agreed to be admitted to the [facility] unit for crisis stabilization.    Tanya Wilson, 35 y.o., female patient seen face to face by this provider, consulted with Dr. Bronwen Betters; and chart reviewed on 09/13/20.  On evaluation Tanya Wilson reports she has a long history of alcohol abuse.  She has two DUI's she received in 2021, states her court date for those DUIs are 09/24/2020.  States both are related to alcohol use.  She also had a miscarriage in 06/2019.  States over the past month her depression and anxiety has increased.  States, "if I do not get some help I am going to go to jail".  Reports a psychiatric history of alcohol use, anxiety, depression, and bipolar disorder.   During evaluation Tanya Wilson is sitting position in no acute distress.  She is well-groomed and makes good eye contact.  She is alert/oriented x 4; cooperative.states that she is anxious to and depressed with congruent with affect.  She is speaking in a clear tone at moderate volume, and fast pace.  States she only sleeps 3 hours per night.  Has decreased appetite with a 20 pound weight loss over the past month.  Reports that she isolates at times and is quick to get irritable.  Her thought process is coherent and relevant; There is no indication that she is currently responding to internal/external stimuli or experiencing delusional thought content; and she has denied  self-harm/homicidal ideation,  psychosis, and paranoia.  Patient endorses suicidal ideations.reports she has nonstop suicidal thoughts.  Denies any and intent, specific plan, or access to means.  Patient cannot contract for safety at this time.  States, "I just do not care anymore".  States that she had one suicide attempt last month when she was drinking she took 10 pills, she did not seek medical attention.  States she had one additional attempt years ago, also attempted overdose.  States she has had one inpatient psychiatric admission in  09/2011 at [facility].  Denies auditory and visual hallucinations.   States she drinks two 4 Loco's a day and at times she drinks until she is drunk.  States her last drink was at 4:00 PM today, 2 hours ago. States she has never had a seizure when she stops drinking.  States she has been clean for as long as 4 months in the past.  Reports the only time she had a seizure == was from medication that she had taken not from alcohol use or withdrawal.  Denies any withdrawal symptoms at this time.  Endorses marijuana use denies any other illegal substance.  States she lives with her partner and her 27 year old son.  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      Reports she is type II diabetic.  States her current medications are BuSpar 5 mg p.o. 3 times daily, Zofran 4 mg p.o. every 8 hours as needed, Glyxambi 10-mg-5mg  PO QD, metformin 500 mg  p.o. twice daily.  Patient's outpatient PCP is Dr. Everlene Other Patient answered questions appropriately.     Patient agreed to be [facility] the [facility] crisis [facility].   PHQ 2-9:  Flowsheet Row ED from 09/13/2020 in [facility]  Thoughts that you would be better off dead, or of hurting yourself in some way Several days  PHQ-9 Total Score 9       Flowsheet Row ED from 09/13/2020 in [facility] ED from 08/02/2020 in [facility]  C-SSRS RISK CATEGORY High Risk  No Risk        Total Time spent with patient: 45 minutes  Musculoskeletal  Strength & Muscle Tone: within normal limits Gait & Station: normal Patient leans: N/A  Psychiatric Specialty Exam  Presentation General Appearance: Appropriate for Environment; Casual  Eye Contact:Good  Speech:Clear and Coherent  Speech Volume:Normal  Handedness:Right   Mood and Affect  Mood:Anxious; Depressed  Affect:Congruent   Thought Process  Thought Processes:Coherent  Descriptions of Associations:Intact  Orientation:Full (Time, Place and Person)  Thought Content:Logical  Diagnosis of Schizophrenia or Schizoaffective disorder in past: No   Hallucinations:Hallucinations: None  Ideas of Reference:None  Suicidal Thoughts:Suicidal Thoughts: Yes, Passive SI Passive Intent and/or Plan: Without Intent; Without Plan; Without Means to Carry Out  Homicidal Thoughts:Homicidal Thoughts: No   Sensorium  Memory:Immediate Good; Recent Good; Remote Good  Judgment:Fair  Insight:Good   Executive Functions  Concentration:Good  Attention Span:Good  Recall:Good  Fund of Knowledge:Good  Language:Good   Psychomotor Activity  Psychomotor Activity:Psychomotor Activity: Normal   Assets  Assets:Communication Skills; Desire for Improvement; Financial Resources/Insurance; Housing; Leisure Time; Physical Health; Vocational/Educational; Resilience; Social Support; Talents/Skills; Transportation; Intimacy   Sleep  Sleep:Sleep: Fair Number of Hours of Sleep: 3   Nutritional Assessment (For OBS and FBC admissions only) Has the patient had a weight loss or gain of 10 pounds or more in the last 3 months?: Yes Has the patient had a decrease in food intake/or appetite?: Yes Does the patient have dental problems?: No Does the patient have eating habits or behaviors that may be indicators of an eating disorder including binging or inducing vomiting?: No Has the patient recently lost  weight without trying?: Yes, 14-23 lbs. Has the patient been eating poorly because of a decreased appetite?: Yes Malnutrition Screening Tool Score: 3 Nutritional Assessment Referrals: Medication/Tx changes    Physical Exam Vitals and nursing note reviewed.  Constitutional:      General: She is not in acute distress.    Appearance: Normal appearance. She is not ill-appearing.  HENT:     Head: Normocephalic.  Eyes:     Conjunctiva/sclera: Conjunctivae normal.  Cardiovascular:     Rate and Rhythm: Normal rate.  Pulmonary:     Effort: Pulmonary effort is normal. No respiratory distress.  Musculoskeletal:        General: Normal range of motion.     Cervical back: Normal range of motion.  Skin:    General: Skin is warm and dry.     Coloration: Skin is not jaundiced or pale.  Neurological:     Mental Status: She is alert and oriented to person, place, and time.  Psychiatric:        Attention and Perception: Attention and perception normal.        Mood and Affect: Mood is anxious and depressed.        Speech: Speech normal.        Behavior: Behavior  normal. Behavior is cooperative.        Thought Content: Thought content includes suicidal ideation. Thought content does not include suicidal plan.        Cognition and Memory: Cognition normal.        Judgment: Judgment is impulsive.   Review of Systems  Constitutional: Negative.  Negative for fever.  HENT: Negative.  Negative for hearing loss.   Eyes: Negative.   Respiratory: Negative.  Negative for cough.   Cardiovascular: Negative.  Negative for chest pain.  Musculoskeletal: Negative.   Skin: Negative.   Neurological: Negative.   Psychiatric/Behavioral:  Positive for depression and suicidal ideas. The patient is nervous/anxious.    Blood pressure 123/83, pulse (!) 123, temperature 98.6 F (37 C), temperature source Oral, resp. rate 16, SpO2 98 %. There is no height or weight on file to calculate BMI.  Past Psychiatric  History: depression, anxiety, Bipolar Disorder   Is the patient at risk to self? Yes  Has the patient been a risk to self in the past 6 months? Yes .    Has the patient been a risk to self within the distant past? Yes   Is the patient a risk to others? No   Has the patient been a risk to others in the past 6 months? No   Has the patient been a risk to others within the distant past? No   Past Medical History:  Past Medical History:  Diagnosis Date   Anxiety    Bipolar affective (HCC)    ETOH abuse    Reflux    No past surgical history on file.  Family History: No family history on file.  Social History:  Social History   Socioeconomic History   Marital status: Single    Spouse name: Not on file   Number of children: Not on file   Years of education: Not on file   Highest education level: Not on file  Occupational History   Not on file  Tobacco Use   Smoking status: Some Days    Types: Cigarettes   Smokeless tobacco: Never  Substance and Sexual Activity   Alcohol use: Yes    Alcohol/week: 3.0 standard drinks    Types: 3 Cans of beer per week    Comment: last drink 5 days ago   Drug use: Not Currently    Types: Marijuana   Sexual activity: Yes    Comment: mirena  Other Topics Concern   Not on file  Social History Narrative   Not on file   Social Determinants of Health   Financial Resource Strain: Not on file  Food Insecurity: Not on file  Transportation Needs: Not on file  Physical Activity: Not on file  Stress: Not on file  Social Connections: Not on file  Intimate Partner Violence: Not on file    SDOH:  SDOH Screenings   Alcohol Screen: Not on file  Depression (PHQ2-9): Medium Risk   PHQ-2 Score: 9  Financial Resource Strain: Not on file  Food Insecurity: Not on file  Housing: Not on file  Physical Activity: Not on file  Social Connections: Not on file  Stress: Not on file  Tobacco Use: High Risk   Smoking Tobacco Use: Some Days   Smokeless  Tobacco Use: Never  Transportation Needs: Not on file    Last Labs:  Admission on 09/13/2020  Component Date Value Ref Range Status   POC Amphetamine UR 09/13/2020 None Detected  NONE DETECTED (Cut Off Level  1000 ng/mL) Final   POC Secobarbital (BAR) 09/13/2020 None Detected  NONE DETECTED (Cut Off Level 300 ng/mL) Final   POC Buprenorphine (BUP) 09/13/2020 None Detected  NONE DETECTED (Cut Off Level 10 ng/mL) Final   POC Oxazepam (BZO) 09/13/2020 None Detected  NONE DETECTED (Cut Off Level 300 ng/mL) Final   POC Cocaine UR 09/13/2020 None Detected  NONE DETECTED (Cut Off Level 300 ng/mL) Final   POC Methamphetamine UR 09/13/2020 None Detected  NONE DETECTED (Cut Off Level 1000 ng/mL) Final   POC Morphine 09/13/2020 None Detected  NONE DETECTED (Cut Off Level 300 ng/mL) Final   POC Oxycodone UR 09/13/2020 None Detected  NONE DETECTED (Cut Off Level 100 ng/mL) Final   POC Methadone UR 09/13/2020 None Detected  NONE DETECTED (Cut Off Level 300 ng/mL) Final   POC Marijuana UR 09/13/2020 Positive (A) NONE DETECTED (Cut Off Level 50 ng/mL) Final   SARS Coronavirus 2 Ag 09/13/2020 Negative  Negative Final   SARSCOV2ONAVIRUS 2 AG 09/13/2020 NEGATIVE  NEGATIVE Final   Comment: (NOTE) SARS-CoV-2 antigen NOT DETECTED.   Negative results are presumptive.  Negative results do not preclude SARS-CoV-2 infection and should not be used as the sole basis for treatment or other patient management decisions, including infection  control decisions, particularly in the presence of clinical signs and  symptoms consistent with COVID-19, or in those who have been in contact with the virus.  Negative results must be combined with clinical observations, patient history, and epidemiological information. The expected result is Negative.  Fact Sheet for Patients: https://www.jennings-kim.com/  Fact Sheet for Healthcare Providers: https://alexander-rogers.biz/  This test is not yet  approved or cleared by the [place] FDA and  has been authorized for detection and/or diagnosis of SARS-CoV-2 by FDA under an Emergency Use Authorization (EUA).  This EUA will remain in effect (meaning this test can be used) for the duration of  the COV                          ID-19 declaration under Section 564(b)(1) of the Act, 21 U.S.C. section 360bbb-3(b)(1), unless the authorization is terminated or revoked sooner.     Preg Test, Ur 09/13/2020 NEGATIVE  NEGATIVE Final   Comment:        THE SENSITIVITY OF THIS METHODOLOGY IS >24 mIU/mL     Allergies: Penicillins  PTA Medications: (Not in a hospital admission)   Medical Decision Making  Patient is endorsing suicidal ideations and cannot contract for safety.  Patient was also requesting help with alcohol abuse.  Patient agreed to be admitted to be [facility].  Recommendations  Based on my evaluation the patient does not appear to have an emergency medical condition.  Patient will be admitted to the [facility].  Home medications were restarted: Metformin 500 mg p.o. twice daily BuSpar 5 mg p.o. 3 times daily.  Discussed Abilify with patient for mood stabilization, 2 mg p.o. daily will be started on 09/14/2020.  Glyxambi 10 mg -5mg  po qd for DM was not listed- will consult pharmacy.   Lab work and EKG ordered while in the .  Lab work and EKG have not resulted at this time.  UDS positive for THC. [employer], NP 09/13/20  8:05 PM

## 2020-09-13 NOTE — ED Notes (Signed)
Patient walked down with MHT to [facility] unit

## 2020-09-13 NOTE — ED Notes (Signed)
Pt is in room sleeping, respirations are even and unlabored, environment check complete/secure, will continue to monitor patient for safety

## 2020-09-13 NOTE — ED Notes (Signed)
Pt has been brought on the unit and is her room quietly

## 2020-09-14 MED ORDER — EMPAGLIFLOZIN 10 MG PO TABS
10.0000 mg | ORAL_TABLET | Freq: Every day | ORAL | Status: DC
  Administered 2020-09-14 – 2020-09-18 (×5): 10 mg via ORAL
  Filled 2020-09-14 (×5): qty 1

## 2020-09-14 MED ORDER — LINAGLIPTIN 5 MG PO TABS
5.0000 mg | ORAL_TABLET | Freq: Every day | ORAL | Status: DC
  Administered 2020-09-14 – 2020-09-18 (×5): 5 mg via ORAL
  Filled 2020-09-14 (×5): qty 1

## 2020-09-14 NOTE — ED Notes (Signed)
PT is currently in the dining room attending AA

## 2020-09-14 NOTE — Progress Notes (Signed)
Patient visible on unit.  Reported having gone to the cafeteria for breakfast and then showered.  Denied SI, HI, AVH, withdrawal symptoms.  Continue to monitor for safety.

## 2020-09-14 NOTE — ED Provider Notes (Signed)
Behavioral Health Progress Note  Date and Time: 09/14/2020 11:01 AM Name: Tanya Wilson MRN:  [id number]  Subjective:    Tanya Wilson, 35 y.o., female patient seen face to face by this provider, chart reviewed and discussed with treatment team and Dr. Bronwen Betters on 09/14/20.    During evaluation [name] is in sitting position in no acute distress.  She is groomed and makes good eye contact.  She appears calm/cooperative, alert/oriented x 4, and does not appear to be responding to internal/external stimuli.  State, "I just feel sad and down today".  Endorses depression with congruent affect.  Denies any withdrawal symptoms, but states she does feel anxious.  Patient's feelings and progress discussed. Reports that she only slept 5 hours last night and still has a decreased appetite.  She is tolerating medications without adverse reactions, and will attend her first group session this morning.  Endorses passive suicidal ideations. She still has thoughts of hurting herself but has no plan, intent, or access to means. Patient does not contract for safety at this time.  Her thought process is coherent and relevant. Patient denied homicidal/self-harm ideation, psychosis, and paranoia.  Denies auditory and visual hallucinations. Reassurance, support, and encouragement provided.  Patient remained calm throughout assessment and answered questions appropriately.   Discussed lab work including EtOH level of 117, UDS positive for THC.  Patient states her A1c at her last check was 12 discussed recent lab work results are 9.6.  Discussed cholesterol levels.  Patient states that she will follow-up with her PCP when she is discharged.  States she does not want to add any medications at this time. Patient is concerned with results and states, "I really am taking this time serious I have got to get off alcohol its affecting my whole body".   Diagnosis:  Final diagnoses:  Alcohol abuse, continuous  Alcohol  dependence with alcohol-induced mood disorder (HCC)    Total Time spent with patient: 30 minutes  Past Psychiatric History: depression, anxiety an bipolar disorder Past Medical History:  Past Medical History:  Diagnosis Date   Anxiety    Bipolar affective (HCC)    ETOH abuse    Reflux    No past surgical history on file. Family History: No family history on file. Family Psychiatric  History: unknown Social History:  Social History   Substance and Sexual Activity  Alcohol Use Yes   Alcohol/week: 3.0 standard drinks   Types: 3 Cans of beer per week   Comment: last drink 5 days ago     Social History   Substance and Sexual Activity  Drug Use Not Currently   Types: Marijuana    Social History   Socioeconomic History   Marital status: Single    Spouse name: Not on file   Number of children: Not on file   Years of education: Not on file   Highest education level: Not on file  Occupational History   Not on file  Tobacco Use   Smoking status: Some Days    Types: Cigarettes   Smokeless tobacco: Never  Substance and Sexual Activity   Alcohol use: Yes    Alcohol/week: 3.0 standard drinks    Types: 3 Cans of beer per week    Comment: last drink 5 days ago   Drug use: Not Currently    Types: Marijuana   Sexual activity: Yes    Comment: mirena  Other Topics Concern   Not on file  Social History Narrative   Not on file  Social Determinants of Corporate investment banker Strain: Not on file  Food Insecurity: Not on file  Transportation Needs: Not on file  Physical Activity: Not on file  Stress: Not on file  Social Connections: Not on file   SDOH:  SDOH Screenings   Alcohol Screen: Not on file  Depression (PHQ2-9): Medium Risk   PHQ-2 Score: 9  Financial Resource Strain: Not on file  Food Insecurity: Not on file  Housing: Not on file  Physical Activity: Not on file  Social Connections: Not on file  Stress: Not on file  Tobacco Use: High Risk   Smoking  Tobacco Use: Some Days   Smokeless Tobacco Use: Never  Transportation Needs: Not on file   Additional Social History:    Pain Medications: no Prescriptions: no Over the Counter: no History of alcohol / drug use?: Yes Longest period of sobriety (when/how long): 5 months Jan.2013 to June 2013 Negative Consequences of Use: Work / Programmer, multimedia, Copywriter, advertising relationships Withdrawal Symptoms:  (none) Name of Substance 1: alcohol 1 - Age of First Use: unk 1 - Amount (size/oz): 2-4 malt beverages 1 - Frequency: 3 x week 1 - Duration: ongoing 1 - Last Use / Amount: 09/12/20 1 - Method of Aquiring: purchase 1- Route of Use: drinking      Sleep: Poor  Appetite:  Poor  Current Medications:  Current Facility-Administered Medications  Medication Dose Route Frequency Provider Last Rate Last Admin   acetaminophen (TYLENOL) tablet 650 mg  650 mg Oral Q6H PRN Ardis Hughs, NP       alum & mag hydroxide-simeth (MAALOX/MYLANTA) 200-200-20 MG/5ML suspension 30 mL  30 mL Oral Q4H PRN Ardis Hughs, NP       ARIPiprazole (ABILIFY) tablet 2 mg  2 mg Oral Daily Ardis Hughs, NP   2 mg at 09/14/20 0904   busPIRone (BUSPAR) tablet 5 mg  5 mg Oral TID Ardis Hughs, NP   5 mg at 09/14/20 9629   empagliflozin (JARDIANCE) tablet 10 mg  10 mg Oral Daily Ardis Hughs, NP   10 mg at 09/14/20 5284   And   linagliptin (TRADJENTA) tablet 5 mg  5 mg Oral Daily Ardis Hughs, NP   5 mg at 09/14/20 1324   hydrOXYzine (ATARAX/VISTARIL) tablet 25 mg  25 mg Oral Q6H PRN Ardis Hughs, NP       loperamide (IMODIUM) capsule 2-4 mg  2-4 mg Oral PRN Ardis Hughs, NP       LORazepam (ATIVAN) tablet 1 mg  1 mg Oral Q6H PRN Ardis Hughs, NP       magnesium hydroxide (MILK OF MAGNESIA) suspension 30 mL  30 mL Oral Daily PRN Ardis Hughs, NP       metFORMIN (GLUCOPHAGE) tablet 500 mg  500 mg Oral BID WC Ardis Hughs, NP   500 mg at 09/14/20 4010   multivitamin with  minerals tablet 1 tablet  1 tablet Oral Daily Ardis Hughs, NP   1 tablet at 09/14/20 0903   ondansetron (ZOFRAN-ODT) disintegrating tablet 4 mg  4 mg Oral Q6H PRN Ardis Hughs, NP       thiamine tablet 100 mg  100 mg Oral Daily Ardis Hughs, NP   100 mg at 09/14/20 2725   traZODone (DESYREL) tablet 50 mg  50 mg Oral QHS PRN Ardis Hughs, NP       Current Outpatient Medications  Medication Sig Dispense Refill  busPIRone (BUSPAR) 5 MG tablet Take 5 mg by mouth 3 (three) times daily.     GLYXAMBI 10-5 MG TABS Take 1 tablet by mouth daily.     metFORMIN (GLUCOPHAGE-XR) 500 MG 24 hr tablet Take 500 mg by mouth 2 (two) times daily.     ondansetron (ZOFRAN-ODT) 4 MG disintegrating tablet Take 4 mg by mouth every 8 (eight) hours as needed for nausea or vomiting.      Labs  Lab Results:  Admission on 09/13/2020  Component Date Value Ref Range Status   SARS Coronavirus 2 by RT PCR 09/13/2020 NEGATIVE  NEGATIVE Final   Comment: (NOTE) SARS-CoV-2 target nucleic acids are NOT DETECTED.  The SARS-CoV-2 RNA is generally detectable in upper respiratory specimens during the acute phase of infection. The lowest concentration of SARS-CoV-2 viral copies this assay can detect is 138 copies/mL. A negative result does not preclude SARS-Cov-2 infection and should not be used as the sole basis for treatment or other patient management decisions. A negative result may occur with  improper specimen collection/handling, submission of specimen other than nasopharyngeal swab, presence of viral mutation(s) within the areas targeted by this assay, and inadequate number of viral copies(<138 copies/mL). A negative result must be combined with clinical observations, patient history, and epidemiological information. The expected result is Negative.  Fact Sheet for Patients:  BloggerCourse.com  Fact Sheet for Healthcare Providers:   SeriousBroker.it  This test is no                          t yet approved or cleared by the [place] FDA and  has been authorized for detection and/or diagnosis of SARS-CoV-2 by FDA under an Emergency Use Authorization (EUA). This EUA will remain  in effect (meaning this test can be used) for the duration of the COVID-19 declaration under Section 564(b)(1) of the Act, 21 U.S.C.section 360bbb-3(b)(1), unless the authorization is terminated  or revoked sooner.       Influenza A by PCR 09/13/2020 NEGATIVE  NEGATIVE Final   Influenza B by PCR 09/13/2020 NEGATIVE  NEGATIVE Final   Comment: (NOTE) The Xpert Xpress SARS-CoV-2/FLU/RSV plus assay is intended as an aid in the diagnosis of influenza from Nasopharyngeal swab specimens and should not be used as a sole basis for treatment. Nasal washings and aspirates are unacceptable for Xpert Xpress SARS-CoV-2/FLU/RSV testing.  Fact Sheet for Patients: BloggerCourse.com  Fact Sheet for Healthcare Providers: SeriousBroker.it  This test is not yet approved or cleared by the [place] FDA and has been authorized for detection and/or diagnosis of SARS-CoV-2 by FDA under an Emergency Use Authorization (EUA). This EUA will remain in effect (meaning this test can be used) for the duration of the COVID-19 declaration under Section 564(b)(1) of the Act, 21 U.S.C. section 360bbb-3(b)(1), unless the authorization is terminated or revoked.  Performed at [facility], [address], [place], [place] [address]    WBC 09/13/2020 6.7  4.0 - 10.5 K/uL Final   RBC 09/13/2020 4.52  3.87 - 5.11 MIL/uL Final   Hemoglobin 09/13/2020 15.5 (A) 12.0 - 15.0 g/dL Final   HCT 35/32/9924 44.2  36.0 - 46.0 % Final   MCV 09/13/2020 97.8  80.0 - 100.0 fL Final   MCH 09/13/2020 34.3 (A) 26.0 - 34.0 pg Final   MCHC 09/13/2020 35.1  30.0 - 36.0 g/dL Final   RDW 26/83/4196 13.2   11.5 - 15.5 % Final   Platelets 09/13/2020  308  150 - 400 K/uL Final   nRBC 09/13/2020 0.0  0.0 - 0.2 % Final   Neutrophils Relative % 09/13/2020 66  % Final   Neutro Abs 09/13/2020 4.4  1.7 - 7.7 K/uL Final   Lymphocytes Relative 09/13/2020 14  % Final   Lymphs Abs 09/13/2020 1.0  0.7 - 4.0 K/uL Final   Monocytes Relative 09/13/2020 17  % Final   Monocytes Absolute 09/13/2020 1.1 (A) 0.1 - 1.0 K/uL Final   Eosinophils Relative 09/13/2020 1  % Final   Eosinophils Absolute 09/13/2020 0.1  0.0 - 0.5 K/uL Final   Basophils Relative 09/13/2020 1  % Final   Basophils Absolute 09/13/2020 0.0  0.0 - 0.1 K/uL Final   Immature Granulocytes 09/13/2020 1  % Final   Abs Immature Granulocytes 09/13/2020 0.04  0.00 - 0.07 K/uL Final   Performed at [facility], [address], [place], [place] [address]   Sodium 09/13/2020 136  135 - 145 mmol/L Final   Potassium 09/13/2020 3.8  3.5 - 5.1 mmol/L Final   Chloride 09/13/2020 98  98 - 111 mmol/L Final   CO2 09/13/2020 22  22 - 32 mmol/L Final   Glucose, Bld 09/13/2020 237 (A) 70 - 99 mg/dL Final   Glucose reference range applies only to samples taken after fasting for at least 8 hours.   BUN 09/13/2020 12  6 - 20 mg/dL Final   Creatinine, Ser 09/13/2020 0.70  0.44 - 1.00 mg/dL Final   Calcium 19/14/7829 9.3  8.9 - 10.3 mg/dL Final   Total Protein 56/21/3086 7.3  6.5 - 8.1 g/dL Final   Albumin 57/84/6962 4.1  3.5 - 5.0 g/dL Final   AST 95/28/4132 36  15 - 41 U/L Final   ALT 09/13/2020 24  0 - 44 U/L Final   Alkaline Phosphatase 09/13/2020 78  38 - 126 U/L Final   Total Bilirubin 09/13/2020 0.4  0.3 - 1.2 mg/dL Final   GFR, Estimated 09/13/2020 >60  >60 mL/min Final   Comment: (NOTE) Calculated using the CKD-EPI Creatinine Equation (2021)    Anion gap 09/13/2020 16 (A) 5 - 15 Final   Performed at [facility], [address] [place], [place] [address]   Hgb A1c MFr Bld 09/13/2020 9.6 (A) 4.8 - 5.6 % Final   Comment: (NOTE) Pre  diabetes:          5.7%-6.4%  Diabetes:              >6.4%  Glycemic control for   <7.0% adults with diabetes    Mean Plasma Glucose 09/13/2020 228.82  mg/dL Final   Performed at [facility], [address] [place], [place] [address]   Alcohol, Ethyl (B) 09/13/2020 197 (A) <10 mg/dL Final   Comment: (NOTE) Lowest detectable limit for serum alcohol is 10 mg/dL.  For medical purposes only. Performed at [facility], [address] [place], [place] [address]    Cholesterol 09/13/2020 235 (A) 0 - 200 mg/dL Final   Triglycerides 34/74/2595 289 (A) <150 mg/dL Final   HDL 63/87/5643 79  >40 mg/dL Final   Total CHOL/HDL Ratio 09/13/2020 3.0  RATIO Final   VLDL 09/13/2020 58 (A) 0 - 40 mg/dL Final   LDL Cholesterol 09/13/2020 98  0 - 99 mg/dL Final   Comment:        Total Cholesterol/HDL:CHD Risk Coronary Heart Disease Risk Table  Men   Women  1/2 Average Risk   3.4   3.3  Average Risk       5.0   4.4  2 X Average Risk   9.6   7.1  3 X Average Risk  23.4   11.0        Use the calculated Patient Ratio above and the CHD Risk Table to determine the patient's CHD Risk.        ATP III CLASSIFICATION (LDL):  <100     mg/dL   Optimal  161-096100-129  mg/dL   Near or Above                    Optimal  130-159  mg/dL   Borderline  045-409160-189  mg/dL   High  >811>190     mg/dL   Very High Performed at [facility], [address] [place], [place] [address]    TSH 09/13/2020 2.250  0.350 - 4.500 uIU/mL Final   Comment: Performed by a 3rd Generation assay with a functional sensitivity of <=0.01 uIU/mL. Performed at [facility], [address] [place], [place] [address]    Color, Urine 09/13/2020 COLORLESS (A) YELLOW Final   APPearance 09/13/2020 CLEAR  CLEAR Final   Specific Gravity, Urine 09/13/2020 1.010  1.005 - 1.030 Final   pH 09/13/2020 6.0  5.0 - 8.0 Final   Glucose, UA 09/13/2020 >=500 (A) NEGATIVE mg/dL Final   Hgb urine dipstick 09/13/2020  NEGATIVE  NEGATIVE Final   Bilirubin Urine 09/13/2020 NEGATIVE  NEGATIVE Final   Ketones, ur 09/13/2020 NEGATIVE  NEGATIVE mg/dL Final   Protein, ur 13/08/657808/22/2022 NEGATIVE  NEGATIVE mg/dL Final   Nitrite 46/96/295208/22/2022 NEGATIVE  NEGATIVE Final   Leukocytes,Ua 09/13/2020 NEGATIVE  NEGATIVE Final   RBC / HPF 09/13/2020 0-5  0 - 5 RBC/hpf Final   Bacteria, UA 09/13/2020 NONE SEEN  NONE SEEN Final   Squamous Epithelial / LPF 09/13/2020 0-5  0 - 5 Final   Performed at [facility], [address] [place], [place] [address]   POC Amphetamine UR 09/13/2020 None Detected  NONE DETECTED (Cut Off Level 1000 ng/mL) Final   POC Secobarbital (BAR) 09/13/2020 None Detected  NONE DETECTED (Cut Off Level 300 ng/mL) Final   POC Buprenorphine (BUP) 09/13/2020 None Detected  NONE DETECTED (Cut Off Level 10 ng/mL) Final   POC Oxazepam (BZO) 09/13/2020 None Detected  NONE DETECTED (Cut Off Level 300 ng/mL) Final   POC Cocaine UR 09/13/2020 None Detected  NONE DETECTED (Cut Off Level 300 ng/mL) Final   POC Methamphetamine UR 09/13/2020 None Detected  NONE DETECTED (Cut Off Level 1000 ng/mL) Final   POC Morphine 09/13/2020 None Detected  NONE DETECTED (Cut Off Level 300 ng/mL) Final   POC Oxycodone UR 09/13/2020 None Detected  NONE DETECTED (Cut Off Level 100 ng/mL) Final   POC Methadone UR 09/13/2020 None Detected  NONE DETECTED (Cut Off Level 300 ng/mL) Final   POC Marijuana UR 09/13/2020 Positive (A) NONE DETECTED (Cut Off Level 50 ng/mL) Final   Preg Test, Ur 09/13/2020 NEGATIVE  NEGATIVE Final   Comment:        THE SENSITIVITY OF THIS METHODOLOGY IS >20 mIU/mL. Performed at [facility], [address] [place], [place] [address]    SARS Coronavirus 2 Ag 09/13/2020 Negative  Negative Final   SARSCOV2ONAVIRUS 2 AG 09/13/2020 NEGATIVE  NEGATIVE Final   Comment: (NOTE) SARS-CoV-2 antigen NOT DETECTED.  Negative results are presumptive.  Negative results do not preclude SARS-CoV-2 infection and  should not be used as the sole basis for treatment or other patient management decisions, including infection  control decisions, particularly in the presence of clinical signs and  symptoms consistent with COVID-19, or in those who have been in contact with the virus.  Negative results must be combined with clinical observations, patient history, and epidemiological information. The expected result is Negative.  Fact Sheet for Patients: https://www.jennings-kim.com/  Fact Sheet for Healthcare Providers: https://alexander-rogers.biz/  This test is not yet approved or cleared by the [place] FDA and  has been authorized for detection and/or diagnosis of SARS-CoV-2 by FDA under an Emergency Use Authorization (EUA).  This EUA will remain in effect (meaning this test can be used) for the duration of  the COV                          ID-19 declaration under Section 564(b)(1) of the Act, 21 U.S.C. section 360bbb-3(b)(1), unless the authorization is terminated or revoked sooner.     Preg Test, Ur 09/13/2020 NEGATIVE  NEGATIVE Final   Comment:        THE SENSITIVITY OF THIS METHODOLOGY IS >24 mIU/mL     Blood Alcohol level:  Lab Results  Component Value Date   ETH 197 (H) 09/13/2020   ETH 372 (HH) 02/13/2019    Metabolic Disorder Labs: Lab Results  Component Value Date   HGBA1C 9.6 (H) 09/13/2020   MPG 228.82 09/13/2020   No results found for: PROLACTIN Lab Results  Component Value Date   CHOL 235 (H) 09/13/2020   TRIG 289 (H) 09/13/2020   HDL 79 09/13/2020   CHOLHDL 3.0 09/13/2020   VLDL 58 (H) 09/13/2020   LDLCALC 98 09/13/2020   LDLCALC 135 (H) 07/17/2017    Therapeutic Lab Levels: No results found for: LITHIUM No results found for: VALPROATE No components found for:  CBMZ  Physical Findings   PHQ2-9    Flowsheet Row ED [facility] 09/13/2020 in [facility]  PHQ-2 Total Score 2  PHQ-9 Total Score 9       Flowsheet Row ED from 09/13/2020 in [facility] ED from 08/02/2020 in [facility]  C-SSRS RISK CATEGORY Moderate Risk No Risk        Musculoskeletal  Strength & Muscle Tone: within normal limits Gait & Station: normal Patient leans: N/A  Psychiatric Specialty Exam  Presentation  General Appearance: Appropriate for Environment; Casual  Eye Contact:Good  Speech:Clear and Coherent; Normal Rate  Speech Volume:Normal  Handedness:Right   Mood and Affect  Mood:Anxious; Depressed  Affect:Congruent   Thought Process  Thought Processes:Coherent  Descriptions of Associations:Intact  Orientation:Full (Time, Place and Person)  Thought Content:Logical  Diagnosis of Schizophrenia or Schizoaffective disorder in past: No    Hallucinations:Hallucinations: None  Ideas of Reference:None  Suicidal Thoughts:Suicidal Thoughts: Yes, Passive SI Passive Intent and/or Plan: Without Intent; Without Plan; Without Access to Means  Homicidal Thoughts:Homicidal Thoughts: No   Sensorium  Memory:Immediate Good; Recent Good; Remote Good  Judgment:Fair  Insight:Good   Executive Functions  Concentration:Good  Attention Span:Good  Recall:Good  Fund of Knowledge:Good  Language:Good   Psychomotor Activity  Psychomotor Activity:Psychomotor Activity: Normal   Assets  Assets:Communication Skills; Desire for Improvement; Financial Resources/Insurance; Housing; Intimacy; Physical Health; Resilience; Social Support; Transportation; Vocational/Educational   Sleep  Sleep:Sleep: Fair Number of Hours of Sleep: 5  Nutritional Assessment (For OBS and [facility] admissions only) Has the patient had a weight loss or gain of 10 pounds or more in the last 3 months?: Yes Has the patient had a decrease in food intake/or appetite?: Yes Does the patient have dental problems?: No Does the patient have eating habits or behaviors that  may be indicators of an eating disorder including binging or inducing vomiting?: No Has the patient recently lost weight without trying?: Yes, 14-23 lbs. Has the patient been eating poorly because of a decreased appetite?: Yes Malnutrition Screening Tool Score: 3 Nutritional Assessment Referrals: Medication/Tx changes    Physical Exam  Physical Exam Vitals and nursing note reviewed.  Constitutional:      General: She is not in acute distress.    Appearance: Normal appearance. She is not ill-appearing.  HENT:     Head: Normocephalic.  Eyes:     General:        Right eye: No discharge.        Left eye: No discharge.     Conjunctiva/sclera: Conjunctivae normal.  Cardiovascular:     Rate and Rhythm: Normal rate.  Pulmonary:     Effort: Pulmonary effort is normal. No respiratory distress.  Musculoskeletal:        General: Normal range of motion.     Cervical back: Normal range of motion.  Skin:    Coloration: Skin is not jaundiced or pale.  Neurological:     Mental Status: She is alert and oriented to person, place, and time.  Psychiatric:        Attention and Perception: Attention and perception normal.        Mood and Affect: Mood is anxious and depressed.        Speech: Speech normal.        Behavior: Behavior normal. Behavior is cooperative.        Thought Content: Thought content includes suicidal ideation. Thought content does not include suicidal plan.        Cognition and Memory: Cognition normal.        Judgment: Judgment is impulsive.   Review of Systems  Constitutional: Negative.  Negative for fever.  HENT: Negative.  Negative for hearing loss.   Eyes: Negative.   Respiratory: Negative.  Negative for cough.   Cardiovascular: Negative.  Negative for chest pain.  Musculoskeletal: Negative.   Skin: Negative.   Neurological: Negative.  Negative for headaches.  Psychiatric/Behavioral:  Positive for depression and suicidal ideas. The patient is nervous/anxious.    Blood pressure 121/90, pulse 64, temperature 98 F (36.7 C), temperature source Tympanic, resp. rate 16, SpO2 100 %. There is no height or weight on file to calculate BMI.  Treatment Plan Summary:  Daily contact with patient to assess and evaluate symptoms and progress in treatment and Medication management  Patient continues to meet criteria for FBC.   Labwork and EKG reviewed. QT/QTC 344/432   Ardis Hughs, NP 09/14/2020 11:01 AM

## 2020-09-14 NOTE — Clinical Social Work Psych Note (Signed)
Cognitive Distortions & Restructuring    Cognitive Distortions & Cognitive Restructuring (CBT & DBT Skills)  Date: 09/14/20  Type of Therapy/Therapeutic Modalities:  Participation Level: Active  Objective: The purpose of this group is to discuss and assist patients in identifying cognitive distortions (negative thinking patterns) that can influence their emotions and behaviors. Facilitators will guide conversations that discuss how these cognitive distortions contribute to common disorders such as anxiety and depression.   Therapeutic Goals:  Patient will identify negative thinking patterns they currently experience and how those thoughts influence their behaviors.  Patient will begin to explore the possible misinformation and/or traumatic experiences that influence their negative thought patterns.  Patient will explore the foundations and techniques of cognitive restructuring by reviewing CBT and DBT techniques.  Patient will discuss the   Summary of Patient's Progress:  Tanya Wilson was engaged and participated throughout the group session. Tanya Wilson reports she struggles with "Jumping to Conclusions", "Emotional Reasoning" and "Mind Reading" as cognitive distortions.

## 2020-09-14 NOTE — ED Notes (Signed)
Patient is in room sleeping, respirations are even and unlabored, environment check has been completed, will continue to monitor patient for safety

## 2020-09-14 NOTE — ED Notes (Signed)
Patient is in room sleeping, respirations are even and unlabored, environment check complete/secure will continue to monitor patient for safety

## 2020-09-14 NOTE — ED Notes (Signed)
Patient attended the evening AA meeting. Patient was attentive and active in group session. Patient answered all discussion questions prompted from the leaders this evening. Patients affect was appropriate.  

## 2020-09-14 NOTE — Group Therapy Note (Signed)
Problem Solving & Overcoming Obstacles Tuesday - Problem Solving & Overcoming Obstacles   Date: 09/14/20  Type of Therapy/Therapeutic Modalities: Group, CBT, Motivational Interviewing, Solution-Focused   Participation Level: Active  Objective - In this group patients will be encouraged to explore what they see as problems/obstacles to their own wellness and recovery. They will be guided to discuss their thoughts, feelings, and behaviors related to these obstacles. The group will process together ways to cope with barriers, with attention given to specific choices patients can make. Each patient will be challenged to identify changes they are motivated to make to overcome their obstacles. This group will be process-oriented, with patients participating in exploration of their own experiences as well as giving and receiving support and challenge from other group members.  Therapeutic Goals:  1. Patient will identify personal and current obstacles as they relate to admission. 2. Patient will identify barriers that currently interfere with their wellness or overcoming obstacles.  3. Patient will identify feelings, thought process and behaviors related to these barriers. 4. Patient will identify two changes they are willing to make to overcome these obstacles:   Summary of Patient Progress Tanya Wilson participated in group. She identified her relationship with her son and husband as an obstacle. She stated that their demands are hard to meet and this causes her to become overwhelmed. She said that she is learning to take space from them and to not overreact to everything they say.

## 2020-09-14 NOTE — ED Notes (Signed)
Patient requested her prn trazodone to help her sleep. Trazodone along with the patients buspar was administered.

## 2020-09-14 NOTE — Progress Notes (Signed)
Patient sitting in dayroom watching television.  Continue to monitor for safety. °

## 2020-09-15 ENCOUNTER — Encounter (HOSPITAL_COMMUNITY): Payer: Self-pay | Admitting: Psychiatry

## 2020-09-15 NOTE — ED Provider Notes (Signed)
Behavioral Health Progress Note  Date and Time: 09/15/2020 8:43 AM Name: Tanya Wilson MRN:  [id number]  Subjective:   Tanya Wilson, 35 y.o., female patient seen face to face by this provider, chart reviewed and discussed with treatment team and Dr. Bronwen Betters on 09/15/20.  On today's reassessment Tanya Wilson is in laying position in her bed asleep in no acute distress.  She is awakened easily.  She is fairly groomed and makes good eye contact.  Her speech is clear, coherent, normal rate and tone.  She is alert and oriented x4, and is calm and cooperative.  States she took trazodone last night for sleep and was able to get 7 hours.  Continues to state her appetite is decreased.  Reports her anxiety has improved, but continues to endorse depression.  Her affect is congruent.  She does not appear to be responding to internal/external stimuli.  Denies any withdrawal symptoms, but states she did get a little nauseous last night and had to take a Zofran.  She continues to tolerate medications without adverse reactions.  States she is attending groups sessions and she is participating. Reports they are helpful. Denies auditory/visual hallucinations and paranoia.  Denies suicidal ideations/self-harm/homicidal ideations.  States her suicidal ideations has improved during her stay.  Patient cannot contract for safety.  States, "I do not trust myself to go home at this point, I feel like I need just a couple more days to get myself together".  States she is worried about her upcoming court date on 09/24/2020. Patient remained calm throughout assessment and answered questions appropriately.  Denies any health concerns at this time.   Diagnosis:  Final diagnoses:  Alcohol abuse, continuous  Alcohol dependence with alcohol-induced mood disorder (HCC)    Total Time spent with patient: 30 minutes  Past Psychiatric History: depression, anxiety an bipolar disorder Past Medical History:  Past Medical History:   Diagnosis Date   Anxiety    Bipolar affective (HCC)    ETOH abuse    Reflux    No past surgical history on file. Family History: No family history on file. Family Psychiatric  History: unknown Social History:  Social History   Substance and Sexual Activity  Alcohol Use Yes   Alcohol/week: 3.0 standard drinks   Types: 3 Cans of beer per week   Comment: last drink 5 days ago     Social History   Substance and Sexual Activity  Drug Use Not Currently   Types: Marijuana    Social History   Socioeconomic History   Marital status: Single    Spouse name: Not on file   Number of children: Not on file   Years of education: Not on file   Highest education level: Not on file  Occupational History   Not on file  Tobacco Use   Smoking status: Some Days    Types: Cigarettes   Smokeless tobacco: Never  Substance and Sexual Activity   Alcohol use: Yes    Alcohol/week: 3.0 standard drinks    Types: 3 Cans of beer per week    Comment: last drink 5 days ago   Drug use: Not Currently    Types: Marijuana   Sexual activity: Yes    Comment: mirena  Other Topics Concern   Not on file  Social History Narrative   Not on file   Social Determinants of Health   Financial Resource Strain: Not on file  Food Insecurity: Not on file  Transportation Needs: Not on file  Physical  Activity: Not on file  Stress: Not on file  Social Connections: Not on file   SDOH:  SDOH Screenings   Alcohol Screen: Not on file  Depression (PHQ2-9): Medium Risk   PHQ-2 Score: 9  Financial Resource Strain: Not on file  Food Insecurity: Not on file  Housing: Not on file  Physical Activity: Not on file  Social Connections: Not on file  Stress: Not on file  Tobacco Use: High Risk   Smoking Tobacco Use: Some Days   Smokeless Tobacco Use: Never  Transportation Needs: Not on file   Additional Social History:    Pain Medications: no Prescriptions: no Over the Counter: no History of alcohol / drug  use?: Yes Longest period of sobriety (when/how long): 5 months Jan.2013 to June 2013 Negative Consequences of Use: Work / Programmer, multimedia, Copywriter, advertising relationships Withdrawal Symptoms:  (none) Name of Substance 1: alcohol 1 - Age of First Use: unk 1 - Amount (size/oz): 2-4 malt beverages 1 - Frequency: 3 x week 1 - Duration: ongoing 1 - Last Use / Amount: 09/12/20 1 - Method of Aquiring: purchase 1- Route of Use: drinking                  Sleep: Fair  Appetite:  Poor  Current Medications:  Current Facility-Administered Medications  Medication Dose Route Frequency Provider Last Rate Last Admin   acetaminophen (TYLENOL) tablet 650 mg  650 mg Oral Q6H PRN Ardis Hughs, NP       alum & mag hydroxide-simeth (MAALOX/MYLANTA) 200-200-20 MG/5ML suspension 30 mL  30 mL Oral Q4H PRN Ardis Hughs, NP       ARIPiprazole (ABILIFY) tablet 2 mg  2 mg Oral Daily Ardis Hughs, NP   2 mg at 09/14/20 4540   busPIRone (BUSPAR) tablet 5 mg  5 mg Oral TID Ardis Hughs, NP   5 mg at 09/14/20 2118   empagliflozin (JARDIANCE) tablet 10 mg  10 mg Oral Daily Ardis Hughs, NP   10 mg at 09/14/20 9811   And   linagliptin (TRADJENTA) tablet 5 mg  5 mg Oral Daily Ardis Hughs, NP   5 mg at 09/14/20 9147   hydrOXYzine (ATARAX/VISTARIL) tablet 25 mg  25 mg Oral Q6H PRN Ardis Hughs, NP       loperamide (IMODIUM) capsule 2-4 mg  2-4 mg Oral PRN Ardis Hughs, NP       LORazepam (ATIVAN) tablet 1 mg  1 mg Oral Q6H PRN Ardis Hughs, NP       magnesium hydroxide (MILK OF MAGNESIA) suspension 30 mL  30 mL Oral Daily PRN Ardis Hughs, NP       metFORMIN (GLUCOPHAGE) tablet 500 mg  500 mg Oral BID WC Ardis Hughs, NP   500 mg at 09/14/20 1707   multivitamin with minerals tablet 1 tablet  1 tablet Oral Daily Ardis Hughs, NP   1 tablet at 09/14/20 0903   ondansetron (ZOFRAN-ODT) disintegrating tablet 4 mg  4 mg Oral Q6H PRN Ardis Hughs, NP   4 mg at  09/14/20 1502   thiamine tablet 100 mg  100 mg Oral Daily Ardis Hughs, NP   100 mg at 09/14/20 8295   traZODone (DESYREL) tablet 50 mg  50 mg Oral QHS PRN Ardis Hughs, NP   50 mg at 09/14/20 2118   Current Outpatient Medications  Medication Sig Dispense Refill   busPIRone (BUSPAR) 5 MG tablet Take  5 mg by mouth 3 (three) times daily.     GLYXAMBI 10-5 MG TABS Take 1 tablet by mouth daily.     metFORMIN (GLUCOPHAGE-XR) 500 MG 24 hr tablet Take 500 mg by mouth 2 (two) times daily.     ondansetron (ZOFRAN-ODT) 4 MG disintegrating tablet Take 4 mg by mouth every 8 (eight) hours as needed for nausea or vomiting.      Labs  Lab Results:  Admission on 09/13/2020  Component Date Value Ref Range Status   SARS Coronavirus 2 by RT PCR 09/13/2020 NEGATIVE  NEGATIVE Final   Comment: (NOTE) SARS-CoV-2 target nucleic acids are NOT DETECTED.  The SARS-CoV-2 RNA is generally detectable in upper respiratory specimens during the acute phase of infection. The lowest concentration of SARS-CoV-2 viral copies this assay can detect is 138 copies/mL. A negative result does not preclude SARS-Cov-2 infection and should not be used as the sole basis for treatment or other patient management decisions. A negative result may occur with  improper specimen collection/handling, submission of specimen other than nasopharyngeal swab, presence of viral mutation(s) within the areas targeted by this assay, and inadequate number of viral copies(<138 copies/mL). A negative result must be combined with clinical observations, patient history, and epidemiological information. The expected result is Negative.  Fact Sheet for Patients:  BloggerCourse.comhttps://www.fda.gov/media/152166/download  Fact Sheet for Healthcare Providers:  SeriousBroker.ithttps://www.fda.gov/media/152162/download  This test is no                          t yet approved or cleared by the [place] FDA and  has been authorized for detection and/or diagnosis of  SARS-CoV-2 by FDA under an Emergency Use Authorization (EUA). This EUA will remain  in effect (meaning this test can be used) for the duration of the COVID-19 declaration under Section 564(b)(1) of the Act, 21 U.S.C.section 360bbb-3(b)(1), unless the authorization is terminated  or revoked sooner.       Influenza A by PCR 09/13/2020 NEGATIVE  NEGATIVE Final   Influenza B by PCR 09/13/2020 NEGATIVE  NEGATIVE Final   Comment: (NOTE) The Xpert Xpress SARS-CoV-2/FLU/RSV plus assay is intended as an aid in the diagnosis of influenza from Nasopharyngeal swab specimens and should not be used as a sole basis for treatment. Nasal washings and aspirates are unacceptable for Xpert Xpress SARS-CoV-2/FLU/RSV testing.  Fact Sheet for Patients: BloggerCourse.comhttps://www.fda.gov/media/152166/download  Fact Sheet for Healthcare Providers: SeriousBroker.ithttps://www.fda.gov/media/152162/download  This test is not yet approved or cleared by the [place] FDA and has been authorized for detection and/or diagnosis of SARS-CoV-2 by FDA under an Emergency Use Authorization (EUA). This EUA will remain in effect (meaning this test can be used) for the duration of the COVID-19 declaration under Section 564(b)(1) of the Act, 21 U.S.C. section 360bbb-3(b)(1), unless the authorization is terminated or revoked.  Performed at [facility], [address] [place], [place] [address]    WBC 09/13/2020 6.7  4.0 - 10.5 K/uL Final   RBC 09/13/2020 4.52  3.87 - 5.11 MIL/uL Final   Hemoglobin 09/13/2020 15.5 (A) 12.0 - 15.0 g/dL Final   HCT 60/45/409808/22/2022 44.2  36.0 - 46.0 % Final   MCV 09/13/2020 97.8  80.0 - 100.0 fL Final   MCH 09/13/2020 34.3 (A) 26.0 - 34.0 pg Final   MCHC 09/13/2020 35.1  30.0 - 36.0 g/dL Final   RDW 11/91/478208/22/2022 13.2  11.5 - 15.5 % Final   Platelets 09/13/2020 308  150 - 400 K/uL Final  nRBC 09/13/2020 0.0  0.0 - 0.2 % Final   Neutrophils Relative % 09/13/2020 66  % Final   Neutro Abs 09/13/2020 4.4  1.7 -  7.7 K/uL Final   Lymphocytes Relative 09/13/2020 14  % Final   Lymphs Abs 09/13/2020 1.0  0.7 - 4.0 K/uL Final   Monocytes Relative 09/13/2020 17  % Final   Monocytes Absolute 09/13/2020 1.1 (A) 0.1 - 1.0 K/uL Final   Eosinophils Relative 09/13/2020 1  % Final   Eosinophils Absolute 09/13/2020 0.1  0.0 - 0.5 K/uL Final   Basophils Relative 09/13/2020 1  % Final   Basophils Absolute 09/13/2020 0.0  0.0 - 0.1 K/uL Final   Immature Granulocytes 09/13/2020 1  % Final   Abs Immature Granulocytes 09/13/2020 0.04  0.00 - 0.07 K/uL Final   Performed at [facility], [address], [place], [place] [address]   Sodium 09/13/2020 136  135 - 145 mmol/L Final   Potassium 09/13/2020 3.8  3.5 - 5.1 mmol/L Final   Chloride 09/13/2020 98  98 - 111 mmol/L Final   CO2 09/13/2020 22  22 - 32 mmol/L Final   Glucose, Bld 09/13/2020 237 (A) 70 - 99 mg/dL Final   Glucose reference range applies only to samples taken after fasting for at least 8 hours.   BUN 09/13/2020 12  6 - 20 mg/dL Final   Creatinine, Ser 09/13/2020 0.70  0.44 - 1.00 mg/dL Final   Calcium 08/65/7846 9.3  8.9 - 10.3 mg/dL Final   Total Protein 96/29/5284 7.3  6.5 - 8.1 g/dL Final   Albumin 13/24/4010 4.1  3.5 - 5.0 g/dL Final   AST 27/25/3664 36  15 - 41 U/L Final   ALT 09/13/2020 24  0 - 44 U/L Final   Alkaline Phosphatase 09/13/2020 78  38 - 126 U/L Final   Total Bilirubin 09/13/2020 0.4  0.3 - 1.2 mg/dL Final   GFR, Estimated 09/13/2020 >60  >60 mL/min Final   Comment: (NOTE) Calculated using the CKD-EPI Creatinine Equation (2021)    Anion gap 09/13/2020 16 (A) 5 - 15 Final   Performed at [facility], [address] [place], [place] [address]   Hgb A1c MFr Bld 09/13/2020 9.6 (A) 4.8 - 5.6 % Final   Comment: (NOTE) Pre diabetes:          5.7%-6.4%  Diabetes:              >6.4%  Glycemic control for   <7.0% adults with diabetes    Mean Plasma Glucose 09/13/2020 228.82  mg/dL Final   Performed at [facility], [address], [place], [place] [address]   Alcohol, Ethyl (B) 09/13/2020 197 (A) <10 mg/dL Final   Comment: (NOTE) Lowest detectable limit for serum alcohol is 10 mg/dL.  For medical purposes only. Performed at [facility], [address], [place], [place] [address]    Cholesterol 09/13/2020 235 (A) 0 - 200 mg/dL Final   Triglycerides 64/33/2951 289 (A) <150 mg/dL Final   HDL 88/41/6606 79  >40 mg/dL Final   Total CHOL/HDL Ratio 09/13/2020 3.0  RATIO Final   VLDL 09/13/2020 58 (A) 0 - 40 mg/dL Final   LDL Cholesterol 09/13/2020 98  0 - 99 mg/dL Final   Comment:        Total Cholesterol/HDL:CHD Risk Coronary Heart Disease Risk Table  Men   Women  1/2 Average Risk   3.4   3.3  Average Risk       5.0   4.4  2 X Average Risk   9.6   7.1  3 X Average Risk  23.4   11.0        Use the calculated Patient Ratio above and the CHD Risk Table to determine the patient's CHD Risk.        ATP III CLASSIFICATION (LDL):  <100     mg/dL   Optimal  161-096  mg/dL   Near or Above                    Optimal  130-159  mg/dL   Borderline  045-409  mg/dL   High  >811     mg/dL   Very High Performed at [facility], [address] [place], [place] [address]    TSH 09/13/2020 2.250  0.350 - 4.500 uIU/mL Final   Comment: Performed by a 3rd Generation assay with a functional sensitivity of <=0.01 uIU/mL. Performed at [facility], [address] [place], [place] [address]    Color, Urine 09/13/2020 COLORLESS (A) YELLOW Final   APPearance 09/13/2020 CLEAR  CLEAR Final   Specific Gravity, Urine 09/13/2020 1.010  1.005 - 1.030 Final   pH 09/13/2020 6.0  5.0 - 8.0 Final   Glucose, UA 09/13/2020 >=500 (A) NEGATIVE mg/dL Final   Hgb urine dipstick 09/13/2020 NEGATIVE  NEGATIVE Final   Bilirubin Urine 09/13/2020 NEGATIVE  NEGATIVE Final   Ketones, ur 09/13/2020 NEGATIVE  NEGATIVE mg/dL Final   Protein, ur 13/08/6576 NEGATIVE  NEGATIVE mg/dL Final    Nitrite 46/96/2952 NEGATIVE  NEGATIVE Final   Leukocytes,Ua 09/13/2020 NEGATIVE  NEGATIVE Final   RBC / HPF 09/13/2020 0-5  0 - 5 RBC/hpf Final   Bacteria, UA 09/13/2020 NONE SEEN  NONE SEEN Final   Squamous Epithelial / LPF 09/13/2020 0-5  0 - 5 Final   Performed at [facility], [address], [place], [place] [address]   POC Amphetamine UR 09/13/2020 None Detected  NONE DETECTED (Cut Off Level 1000 ng/mL) Final   POC Secobarbital (BAR) 09/13/2020 None Detected  NONE DETECTED (Cut Off Level 300 ng/mL) Final   POC Buprenorphine (BUP) 09/13/2020 None Detected  NONE DETECTED (Cut Off Level 10 ng/mL) Final   POC Oxazepam (BZO) 09/13/2020 None Detected  NONE DETECTED (Cut Off Level 300 ng/mL) Final   POC Cocaine UR 09/13/2020 None Detected  NONE DETECTED (Cut Off Level 300 ng/mL) Final   POC Methamphetamine UR 09/13/2020 None Detected  NONE DETECTED (Cut Off Level 1000 ng/mL) Final   POC Morphine 09/13/2020 None Detected  NONE DETECTED (Cut Off Level 300 ng/mL) Final   POC Oxycodone UR 09/13/2020 None Detected  NONE DETECTED (Cut Off Level 100 ng/mL) Final   POC Methadone UR 09/13/2020 None Detected  NONE DETECTED (Cut Off Level 300 ng/mL) Final   POC Marijuana UR 09/13/2020 Positive (A) NONE DETECTED (Cut Off Level 50 ng/mL) Final   Preg Test, Ur 09/13/2020 NEGATIVE  NEGATIVE Final   Comment:        THE SENSITIVITY OF THIS METHODOLOGY IS >20 mIU/mL. Performed at [facility], [address] [place], [place] [address]    SARS Coronavirus 2 Ag 09/13/2020 Negative  Negative Final   SARSCOV2ONAVIRUS 2 AG 09/13/2020 NEGATIVE  NEGATIVE Final   Comment: (NOTE) SARS-CoV-2 antigen NOT DETECTED.  Negative results are presumptive.  Negative results do not preclude SARS-CoV-2 infection and should not be used as the sole basis for treatment or other patient management decisions, including infection  control decisions, particularly in the presence of clinical signs and  symptoms  consistent with COVID-19, or in those who have been in contact with the virus.  Negative results must be combined with clinical observations, patient history, and epidemiological information. The expected result is Negative.  Fact Sheet for Patients: https://www.jennings-kim.com/  Fact Sheet for Healthcare Providers: https://alexander-rogers.biz/  This test is not yet approved or cleared by the [place] FDA and  has been authorized for detection and/or diagnosis of SARS-CoV-2 by FDA under an Emergency Use Authorization (EUA).  This EUA will remain in effect (meaning this test can be used) for the duration of  the COV                          ID-19 declaration under Section 564(b)(1) of the Act, 21 U.S.C. section 360bbb-3(b)(1), unless the authorization is terminated or revoked sooner.     Preg Test, Ur 09/13/2020 NEGATIVE  NEGATIVE Final   Comment:        THE SENSITIVITY OF THIS METHODOLOGY IS >24 mIU/mL     Blood Alcohol level:  Lab Results  Component Value Date   ETH 197 (H) 09/13/2020   ETH 372 (HH) 02/13/2019    Metabolic Disorder Labs: Lab Results  Component Value Date   HGBA1C 9.6 (H) 09/13/2020   MPG 228.82 09/13/2020   No results found for: PROLACTIN Lab Results  Component Value Date   CHOL 235 (H) 09/13/2020   TRIG 289 (H) 09/13/2020   HDL 79 09/13/2020   CHOLHDL 3.0 09/13/2020   VLDL 58 (H) 09/13/2020   LDLCALC 98 09/13/2020   LDLCALC 135 (H) 07/17/2017    Therapeutic Lab Levels: No results found for: LITHIUM No results found for: VALPROATE No components found for:  CBMZ  Physical Findings   PHQ2-9    Flowsheet Row ED from 09/13/2020 in [facility]  PHQ-2 Total Score 2  PHQ-9 Total Score 9      Flowsheet Row ED from 09/13/2020 in [facility] ED from 08/02/2020 in [facility]  C-SSRS RISK CATEGORY Moderate Risk No Risk         Musculoskeletal  Strength & Muscle Tone: within normal limits Gait & Station: normal Patient leans: N/A  Psychiatric Specialty Exam  Presentation  General Appearance: Appropriate for Environment  Eye Contact:Good  Speech:Clear and Coherent; Normal Rate  Speech Volume:Normal  Handedness:Right   Mood and Affect  Mood:Depressed  Affect:Congruent   Thought Process  Thought Processes:Coherent  Descriptions of Associations:Intact  Orientation:Full (Time, Place and Person)  Thought Content:Logical  Diagnosis of Schizophrenia or Schizoaffective disorder in past: No    Hallucinations:Hallucinations: None  Ideas of Reference:None  Suicidal Thoughts:Suicidal Thoughts: No SI Passive Intent and/or Plan: Without Intent; Without Plan; Without Access to Means  Homicidal Thoughts:Homicidal Thoughts: No   Sensorium  Memory:Immediate Good; Recent Good; Remote Good  Judgment:Good  Insight:Good   Executive Functions  Concentration:Good  Attention Span:Good  Recall:Good  Fund of Knowledge:Good  Language:Good   Psychomotor Activity  Psychomotor Activity:Psychomotor Activity: Normal   Assets  Assets:Communication Skills; Desire for Improvement; Financial Resources/Insurance; Housing; Intimacy; Physical Health; Resilience; Social Support; Transportation; Vocational/Educational   Sleep  Sleep:Sleep: Fair Number of Hours of Sleep: 7   No  data recorded  Physical Exam  Physical Exam Vitals and nursing note reviewed.  Constitutional:      General: She is not in acute distress.    Appearance: Normal appearance. She is not ill-appearing.  HENT:     Head: Normocephalic.  Eyes:     General:        Right eye: No discharge.        Left eye: No discharge.  Cardiovascular:     Rate and Rhythm: Normal rate.  Pulmonary:     Effort: Pulmonary effort is normal. No respiratory distress.  Musculoskeletal:        General: Normal range of motion.     Cervical  back: Normal range of motion.  Skin:    Coloration: Skin is not jaundiced or pale.  Neurological:     Mental Status: She is alert and oriented to person, place, and time.  Psychiatric:        Attention and Perception: Attention and perception normal.        Mood and Affect: Mood is depressed.        Speech: Speech normal.        Behavior: Behavior normal. Behavior is cooperative.        Thought Content: Thought content normal.        Cognition and Memory: Cognition normal.        Judgment: Judgment is impulsive.   Review of Systems  Constitutional: Negative.  Negative for chills and fever.  HENT: Negative.  Negative for hearing loss.   Eyes: Negative.   Respiratory: Negative.  Negative for cough.   Cardiovascular: Negative.  Negative for chest pain.  Musculoskeletal: Negative.   Skin: Negative.   Neurological: Negative.   Endo/Heme/Allergies:  Negative for polydipsia.  Psychiatric/Behavioral:  Positive for depression.   Blood pressure 127/89, pulse 95, temperature 98.5 F (36.9 C), temperature source Oral, resp. rate 16, SpO2 100 %. There is no height or weight on file to calculate BMI.  Treatment Plan Summary: Daily contact with patient to assess and evaluate symptoms and progress in treatment and Medication management  Patient continues to meet criteria for treatment in the [facility].  Patient agrees to continue to participate in group sessions.   No medication changes at this time.   Ardis Hughs, NP 09/15/2020 8:43 AM

## 2020-09-15 NOTE — Progress Notes (Signed)
Pt is presently resting quietly in her room. No acute distress noted. Pt complained of headache on the scale of 6. PRN Tylenol and scheduled meds administered. Pt denies SI/HI/AVH at this time. Pt's safety is maintained.

## 2020-09-15 NOTE — ED Notes (Signed)
Pt asleep in bed. Respirations even and unlabored. Will continue to monitor for safety. ?
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
## 2020-09-15 NOTE — ED Notes (Signed)
Pt is in bed sleeping, respirations are even/unlabored, environment check complete secure, will continue to monitor patient for safety 

## 2020-09-15 NOTE — Group Therapy Note (Signed)
Personal Development Wednesday - Personal Development (Establishing Goals/Identifying Values)  Date: 09/15/20  Type of Therapy/Therapeutic Modalities: Group, Solution-Focused, CBT, Motivational Interviewing   Participation Level: Active  Objective: To assist patients in identifying personal values that influence the goals they want to accomplish throughout their lives. Facilitators will guide discussion surrounding the framework of cognitive behavioral therapy - the concept our thoughts, feelings and behavioral responses are connected. Patients will reflect on how their values and goals will motivate them to a road of recovery.   Therapeutic Goals:  1. Patient will review the cognitive triangle to understand how thoughts, influence feelings and our feelings influence our behavioral responses.  2. Patient will identify personal values and discuss how they reflect in the patient's life currently.  3. Patient will identify current goals and how they can develop a strategy for identifying solutions.  4. Patient will participate in group discussion to reflect and share experiences for additional support.   Summary of Patient's Progress: Pt was active in group. Stated what she wants to improve within herself and family. Pt eager to work on personal goal of relaxation and peacefulness.

## 2020-09-15 NOTE — Clinical Social Work Psych Note (Addendum)
CSW Update  09/14/20  CSW met with patient for introduction and to begin discussions regarding discharge planning. Patient shared that she came into the facility to seek help for her depressive symptoms and alcohol use issues. She reported that she has struggled with an increase of anxiety and depressive symptoms for the last year. She identified a miscarriage that occurred last month as the main stressor that has contributed to the increase of her symptoms. 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   Patient shared that she has a court date on 09/24/20 in regards to those charges. Patient reports she understands she does not qualify for residential rehabilitation services at this time, due to her court date, however she would like resources and facility's information for her and her family to follow up with once she has resolved her legal issues.   Patient reports she plans to return home with her partner and her son. She also expressed interest in outpatient psychiatric services for continuity of care for her depression.    CSW will continue to follow for any additional needs or concerns.    Tanya Wilson, MSW, LCSW Clinical Education officer, museum ([place]) [facility]

## 2020-09-15 NOTE — ED Notes (Signed)
Pt sitting in dining room watching TV. Pt A&O x4, calm and cooperative. Pt denies current SI/HI/AVH, however is unable to contract for safety and states if she leaves here she is "afraid I will start having those thoughts again." Pt denies any immediate needs at this time. Will continue to monitor for safety.

## 2020-09-15 NOTE — Progress Notes (Signed)
Pt attended [facility] group therapy.   

## 2020-09-15 NOTE — Progress Notes (Signed)
Pt is presently resting quietly. No distress noted. Pt's safety is maintained.

## 2020-09-15 NOTE — Progress Notes (Signed)
Pt is asleep. Respirations are even and unlabored. No signs of acute distress noted. Staff will monitor for pt's safety. 

## 2020-09-15 NOTE — ED Notes (Signed)
Given  breakfast

## 2020-09-16 MED ORDER — HYDROXYZINE HCL 25 MG PO TABS
25.0000 mg | ORAL_TABLET | Freq: Three times a day (TID) | ORAL | Status: DC | PRN
  Administered 2020-09-16 – 2020-09-17 (×2): 25 mg via ORAL
  Filled 2020-09-16 (×2): qty 1

## 2020-09-16 NOTE — Group Therapy Note (Signed)
Patient described an event that she says turned her to depression. She stated that she feels better about that situation and is continuing to get better. Patient was thankful for the conversation discussed in group.

## 2020-09-16 NOTE — ED Notes (Signed)
Pt A&O x 4, no distress noted, pt attending AA Meeting at present.   Pt calm & cooperative.  Monitoring for safety.

## 2020-09-16 NOTE — Progress Notes (Signed)
Pt is watching TV. Pt did not complain of any pain or discomfort. No distress noted. Pt's safety is maintained.

## 2020-09-16 NOTE — ED Notes (Signed)
Pt asleep in bed. Respirations even and unlabored. Will continue to monitor for safety. ?

## 2020-09-16 NOTE — Clinical Social Work Psych Note (Signed)
Trauma & Grief  Trauma & Grief (Psychoeducational/Processing Group)  Date: 09/16/20  Type of Therapy/Therapeutic Modalities: Group Discussion, Psycho-Education  Participation Level: Active  Objective: The purpose of this group is to discuss and assist patients in identifying cognitive distortions (negative thinking patterns) that can influence their emotions and behaviors. Facilitators will guide conversations that discuss how these cognitive distortions contribute to common disorders such as anxiety and depression.   Therapeutic Goals:   1. Patient will identify negative thinking patterns they currently experience and how those thoughts influence their behaviors.   2. Patient will begin to explore the possible misinformation and/or traumatic experiences that influence their negative thought patterns.   3. Patient will explore the foundations and techniques of cognitive restructuring by reviewing CBT and DBT techniques.   4. Patient will discuss their reflections and how they plan to implement cognitive restructuring techniques to address cognitive distortions they may experience.    Summary of Patient's Progress:  Tanya Wilson was engaged and participated throughout the group session.

## 2020-09-16 NOTE — Progress Notes (Signed)
Pt is awake, alert and oriented. Pt complained of nausea. PRN Zofran administered. Pt is presently eating breakfast. Pt denies pain, SI/HI/AVH at this time. Staff will monitor for pt's safety.

## 2020-09-16 NOTE — ED Provider Notes (Signed)
Behavioral Health Progress Note  Date and Time: 09/16/2020 10:54 AM Name: Tanya Wilson MRN:  [id number]  Subjective:    Tanya Wilson, 35 y.o., female patient seen face to face by this provider, chart reviewed and discussed with treatment team and Dr. Bronwen Betters on 09/16/20.  On today's reassessment Tanya Wilson is in the day room coloring and walking on her notes from a previous group session.  She makes good eye contact. She is calm/cooperative, alert/oriented x 4, with pleasant affect, and does not appear to be responding to internal/external stimuli. Patient continues to report a decreased appetite and difficulty sleeping.  Reports difficulty sleeping is due to the noise on the unit at nighttime.  She continues to tolerate medications without adverse reaction. She attends and participates in group sessions.  Reports her depression has improved since admission.  Denies any withdrawal symptoms.  States she still does not trust herself to go home and to stay sober at this time. States, "I am getting there maybe I just need another day or 2".  Patient's feelings and progress discussed. Patient denied suicidal/homicidal/self-harm ideation, psychosis, and paranoia.  Denies auditory and visual hallucinations.denies any current health concerns.  Reassurance, support, and encouragement provided.   Diagnosis:  Final diagnoses:  Alcohol abuse, continuous  Alcohol dependence with alcohol-induced mood disorder (HCC)    Total Time spent with patient: 30 minutes  Past Psychiatric History: Depression, anxiety, bipolar disorder Past Medical History:  Past Medical History:  Diagnosis Date   Anxiety    Bipolar affective (HCC)    ETOH abuse    Reflux    No past surgical history on file. Family History: No family history on file. Family Psychiatric  History: Unknown Social History:  Social History   Substance and Sexual Activity  Alcohol Use Yes   Alcohol/week: 3.0 standard drinks    Types: 3 Cans of beer per week   Comment: last drink 5 days ago     Social History   Substance and Sexual Activity  Drug Use Not Currently   Types: Marijuana    Social History   Socioeconomic History   Marital status: Single    Spouse name: Not on file   Number of children: Not on file   Years of education: Not on file   Highest education level: Not on file  Occupational History   Not on file  Tobacco Use   Smoking status: Some Days    Types: Cigarettes   Smokeless tobacco: Never  Substance and Sexual Activity   Alcohol use: Yes    Alcohol/week: 3.0 standard drinks    Types: 3 Cans of beer per week    Comment: last drink 5 days ago   Drug use: Not Currently    Types: Marijuana   Sexual activity: Yes    Comment: mirena  Other Topics Concern   Not on file  Social History Narrative   Not on file   Social Determinants of Health   Financial Resource Strain: Not on file  Food Insecurity: Not on file  Transportation Needs: Not on file  Physical Activity: Not on file  Stress: Not on file  Social Connections: Not on file   SDOH:  SDOH Screenings   Alcohol Screen: Not on file  Depression (PHQ2-9): Medium Risk   PHQ-2 Score: 9  Financial Resource Strain: Not on file  Food Insecurity: Not on file  Housing: Not on file  Physical Activity: Not on file  Social Connections: Not on file  Stress: Not on  file  Tobacco Use: High Risk   Smoking Tobacco Use: Some Days   Smokeless Tobacco Use: Never  Transportation Needs: Not on file   Additional Social History:    Pain Medications: no Prescriptions: no Over the Counter: no History of alcohol / drug use?: Yes Longest period of sobriety (when/how long): 5 months Jan.2013 to June 2013 Negative Consequences of Use: Work / Programmer, multimediachool, Copywriter, advertisingersonal relationships Withdrawal Symptoms:  (none) Name of Substance 1: alcohol 1 - Age of First Use: unk 1 - Amount (size/oz): 2-4 malt beverages 1 - Frequency: 3 x week 1 - Duration:  ongoing 1 - Last Use / Amount: 09/12/20 1 - Method of Aquiring: purchase 1- Route of Use: drinking    Sleep: Fair  Appetite:  Fair  Current Medications:  Current Facility-Administered Medications  Medication Dose Route Frequency Provider Last Rate Last Admin   acetaminophen (TYLENOL) tablet 650 mg  650 mg Oral Q6H PRN Ardis Hughsoleman, Ryley Teater H, NP   650 mg at 09/15/20 0915   alum & mag hydroxide-simeth (MAALOX/MYLANTA) 200-200-20 MG/5ML suspension 30 mL  30 mL Oral Q4H PRN Ardis Hughsoleman, Allyah Heather H, NP       ARIPiprazole (ABILIFY) tablet 2 mg  2 mg Oral Daily Ardis Hughsoleman, Michaelann Gunnoe H, NP   2 mg at 09/16/20 [id number]   busPIRone (BUSPAR) tablet 5 mg  5 mg Oral TID Ardis Hughsoleman, Francisco Ostrovsky H, NP   5 mg at 09/16/20 [id number]   empagliflozin (JARDIANCE) tablet 10 mg  10 mg Oral Daily Ardis Hughsoleman, Winona Sison H, NP   10 mg at 09/16/20 [id number]   And   linagliptin (TRADJENTA) tablet 5 mg  5 mg Oral Daily Ardis Hughsoleman, Jacquelyn Shadrick H, NP   5 mg at 09/16/20 [id number]   hydrOXYzine (ATARAX/VISTARIL) tablet 25 mg  25 mg Oral Q6H PRN Ardis Hughsoleman, Chirstina Haan H, NP   25 mg at 09/15/20 2128   loperamide (IMODIUM) capsule 2-4 mg  2-4 mg Oral PRN Ardis Hughsoleman, Sharyn Brilliant H, NP       LORazepam (ATIVAN) tablet 1 mg  1 mg Oral Q6H PRN Ardis Hughsoleman, Calogero Geisen H, NP       magnesium hydroxide (MILK OF MAGNESIA) suspension 30 mL  30 mL Oral Daily PRN Ardis Hughsoleman, Shanekia Latella H, NP       metFORMIN (GLUCOPHAGE) tablet 500 mg  500 mg Oral BID WC Ardis Hughsoleman, Donnavan Covault H, NP   500 mg at 09/16/20 [id number]   multivitamin with minerals tablet 1 tablet  1 tablet Oral Daily Ardis Hughsoleman, Jaselynn Tamas H, NP   1 tablet at 09/16/20 0903   ondansetron (ZOFRAN-ODT) disintegrating tablet 4 mg  4 mg Oral Q6H PRN Ardis Hughsoleman, Mycah Mcdougall H, NP   4 mg at 09/16/20 [id number]   thiamine tablet 100 mg  100 mg Oral Daily Ardis Hughsoleman, Deirdre Gryder H, NP   100 mg at 09/16/20 [id number]   traZODone (DESYREL) tablet 50 mg  50 mg Oral QHS PRN Ardis Hughsoleman, Blimi Godby H, NP   50 mg at 09/15/20 2128   Current Outpatient Medications  Medication Sig Dispense Refill   busPIRone (BUSPAR) 5  MG tablet Take 5 mg by mouth 3 (three) times daily.     GLYXAMBI 10-5 MG TABS Take 1 tablet by mouth daily.     metFORMIN (GLUCOPHAGE-XR) 500 MG 24 hr tablet Take 500 mg by mouth 2 (two) times daily.     ondansetron (ZOFRAN-ODT) 4 MG disintegrating tablet Take 4 mg by mouth every 8 (eight) hours as needed for nausea or vomiting.      Labs  Lab Results:  Admission on 09/13/2020  Component Date Value Ref Range Status   SARS Coronavirus 2 by RT PCR 09/13/2020 NEGATIVE  NEGATIVE Final   Comment: (NOTE) SARS-CoV-2 target nucleic acids are NOT DETECTED.  The SARS-CoV-2 RNA is generally detectable in upper respiratory specimens during the acute phase of infection. The lowest concentration of SARS-CoV-2 viral copies this assay can detect is 138 copies/mL. A negative result does not preclude SARS-Cov-2 infection and should not be used as the sole basis for treatment or other patient management decisions. A negative result may occur with  improper specimen collection/handling, submission of specimen other than nasopharyngeal swab, presence of viral mutation(s) within the areas targeted by this assay, and inadequate number of viral copies(<138 copies/mL). A negative result must be combined with clinical observations, patient history, and epidemiological information. The expected result is Negative.  Fact Sheet for Patients:  BloggerCourse.com  Fact Sheet for Healthcare Providers:  SeriousBroker.it  This test is no                          t yet approved or cleared by the [place] FDA and  has been authorized for detection and/or diagnosis of SARS-CoV-2 by FDA under an Emergency Use Authorization (EUA). This EUA will remain  in effect (meaning this test can be used) for the duration of the COVID-19 declaration under Section 564(b)(1) of the Act, 21 U.S.C.section 360bbb-3(b)(1), unless the authorization is terminated  or revoked sooner.        Influenza A by PCR 09/13/2020 NEGATIVE  NEGATIVE Final   Influenza B by PCR 09/13/2020 NEGATIVE  NEGATIVE Final   Comment: (NOTE) The Xpert Xpress SARS-CoV-2/FLU/RSV plus assay is intended as an aid in the diagnosis of influenza from Nasopharyngeal swab specimens and should not be used as a sole basis for treatment. Nasal washings and aspirates are unacceptable for Xpert Xpress SARS-CoV-2/FLU/RSV testing.  Fact Sheet for Patients: BloggerCourse.com  Fact Sheet for Healthcare Providers: SeriousBroker.it  This test is not yet approved or cleared by the [place] FDA and has been authorized for detection and/or diagnosis of SARS-CoV-2 by FDA under an Emergency Use Authorization (EUA). This EUA will remain in effect (meaning this test can be used) for the duration of the COVID-19 declaration under Section 564(b)(1) of the Act, 21 U.S.C. section 360bbb-3(b)(1), unless the authorization is terminated or revoked.  Performed at [facility], [address] [place], [place] [address]    WBC 09/13/2020 6.7  4.0 - 10.5 K/uL Final   RBC 09/13/2020 4.52  3.87 - 5.11 MIL/uL Final   Hemoglobin 09/13/2020 15.5 (A) 12.0 - 15.0 g/dL Final   HCT 26/71/2458 44.2  36.0 - 46.0 % Final   MCV 09/13/2020 97.8  80.0 - 100.0 fL Final   MCH 09/13/2020 34.3 (A) 26.0 - 34.0 pg Final   MCHC 09/13/2020 35.1  30.0 - 36.0 g/dL Final   RDW 09/98/3382 13.2  11.5 - 15.5 % Final   Platelets 09/13/2020 308  150 - 400 K/uL Final   nRBC 09/13/2020 0.0  0.0 - 0.2 % Final   Neutrophils Relative % 09/13/2020 66  % Final   Neutro Abs 09/13/2020 4.4  1.7 - 7.7 K/uL Final   Lymphocytes Relative 09/13/2020 14  % Final   Lymphs Abs 09/13/2020 1.0  0.7 - 4.0 K/uL Final   Monocytes Relative 09/13/2020 17  % Final   Monocytes Absolute 09/13/2020 1.1 (A) 0.1 - 1.0 K/uL Final   Eosinophils Relative 09/13/2020 1  %  Final   Eosinophils Absolute 09/13/2020 0.1   0.0 - 0.5 K/uL Final   Basophils Relative 09/13/2020 1  % Final   Basophils Absolute 09/13/2020 0.0  0.0 - 0.1 K/uL Final   Immature Granulocytes 09/13/2020 1  % Final   Abs Immature Granulocytes 09/13/2020 0.04  0.00 - 0.07 K/uL Final   Performed at [facility], [address], [place], [place] [address]   Sodium 09/13/2020 136  135 - 145 mmol/L Final   Potassium 09/13/2020 3.8  3.5 - 5.1 mmol/L Final   Chloride 09/13/2020 98  98 - 111 mmol/L Final   CO2 09/13/2020 22  22 - 32 mmol/L Final   Glucose, Bld 09/13/2020 237 (A) 70 - 99 mg/dL Final   Glucose reference range applies only to samples taken after fasting for at least 8 hours.   BUN 09/13/2020 12  6 - 20 mg/dL Final   Creatinine, Ser 09/13/2020 0.70  0.44 - 1.00 mg/dL Final   Calcium 65/03/5463 9.3  8.9 - 10.3 mg/dL Final   Total Protein 68/12/7515 7.3  6.5 - 8.1 g/dL Final   Albumin 00/17/4944 4.1  3.5 - 5.0 g/dL Final   AST 96/75/9163 36  15 - 41 U/L Final   ALT 09/13/2020 24  0 - 44 U/L Final   Alkaline Phosphatase 09/13/2020 78  38 - 126 U/L Final   Total Bilirubin 09/13/2020 0.4  0.3 - 1.2 mg/dL Final   GFR, Estimated 09/13/2020 >60  >60 mL/min Final   Comment: (NOTE) Calculated using the CKD-EPI Creatinine Equation (2021)    Anion gap 09/13/2020 16 (A) 5 - 15 Final   Performed at [facility], [address], [place], [place] [address]   Hgb A1c MFr Bld 09/13/2020 9.6 (A) 4.8 - 5.6 % Final   Comment: (NOTE) Pre diabetes:          5.7%-6.4%  Diabetes:              >6.4%  Glycemic control for   <7.0% adults with diabetes    Mean Plasma Glucose 09/13/2020 228.82  mg/dL Final   Performed at [facility], [address] [place], [place] [address]   Alcohol, Ethyl (B) 09/13/2020 197 (A) <10 mg/dL Final   Comment: (NOTE) Lowest detectable limit for serum alcohol is 10 mg/dL.  For medical purposes only. Performed at [facility], [address], [place], [place] [address]    Cholesterol  09/13/2020 235 (A) 0 - 200 mg/dL Final   Triglycerides 39/03/90 289 (A) <150 mg/dL Final   HDL 33/00/7622 79  >40 mg/dL Final   Total CHOL/HDL Ratio 09/13/2020 3.0  RATIO Final   VLDL 09/13/2020 58 (A) 0 - 40 mg/dL Final   LDL Cholesterol 09/13/2020 98  0 - 99 mg/dL Final   Comment:        Total Cholesterol/HDL:CHD Risk Coronary Heart Disease Risk Table                     Men   Women  1/2 Average Risk   3.4   3.3  Average Risk       5.0   4.4  2 X Average Risk   9.6   7.1  3 X Average Risk  23.4   11.0        Use the calculated Patient Ratio above and the CHD Risk Table to determine the patient's CHD Risk.  ATP III CLASSIFICATION (LDL):  <100     mg/dL   Optimal  782-956  mg/dL   Near or Above                    Optimal  130-159  mg/dL   Borderline  213-086  mg/dL   High  >578     mg/dL   Very High Performed at [facility], [address] [place], [place] [address]    TSH 09/13/2020 2.250  0.350 - 4.500 uIU/mL Final   Comment: Performed by a 3rd Generation assay with a functional sensitivity of <=0.01 uIU/mL. Performed at [facility], [address] [place], [place] [address]    Color, Urine 09/13/2020 COLORLESS (A) YELLOW Final   APPearance 09/13/2020 CLEAR  CLEAR Final   Specific Gravity, Urine 09/13/2020 1.010  1.005 - 1.030 Final   pH 09/13/2020 6.0  5.0 - 8.0 Final   Glucose, UA 09/13/2020 >=500 (A) NEGATIVE mg/dL Final   Hgb urine dipstick 09/13/2020 NEGATIVE  NEGATIVE Final   Bilirubin Urine 09/13/2020 NEGATIVE  NEGATIVE Final   Ketones, ur 09/13/2020 NEGATIVE  NEGATIVE mg/dL Final   Protein, ur 13/24/4010 NEGATIVE  NEGATIVE mg/dL Final   Nitrite 27/25/3664 NEGATIVE  NEGATIVE Final   Leukocytes,Ua 09/13/2020 NEGATIVE  NEGATIVE Final   RBC / HPF 09/13/2020 0-5  0 - 5 RBC/hpf Final   Bacteria, UA 09/13/2020 NONE SEEN  NONE SEEN Final   Squamous Epithelial / LPF 09/13/2020 0-5  0 - 5 Final   Performed at [facility], [address] [place], [place] [address]   POC Amphetamine UR 09/13/2020 None Detected  NONE DETECTED (Cut Off Level 1000 ng/mL) Final   POC Secobarbital (BAR) 09/13/2020 None Detected  NONE DETECTED (Cut Off Level 300 ng/mL) Final   POC Buprenorphine (BUP) 09/13/2020 None Detected  NONE DETECTED (Cut Off Level 10 ng/mL) Final   POC Oxazepam (BZO) 09/13/2020 None Detected  NONE DETECTED (Cut Off Level 300 ng/mL) Final   POC Cocaine UR 09/13/2020 None Detected  NONE DETECTED (Cut Off Level 300 ng/mL) Final   POC Methamphetamine UR 09/13/2020 None Detected  NONE DETECTED (Cut Off Level 1000 ng/mL) Final   POC Morphine 09/13/2020 None Detected  NONE DETECTED (Cut Off Level 300 ng/mL) Final   POC Oxycodone UR 09/13/2020 None Detected  NONE DETECTED (Cut Off Level 100 ng/mL) Final   POC Methadone UR 09/13/2020 None Detected  NONE DETECTED (Cut Off Level 300 ng/mL) Final   POC Marijuana UR 09/13/2020 Positive (A) NONE DETECTED (Cut Off Level 50 ng/mL) Final   Preg Test, Ur 09/13/2020 NEGATIVE  NEGATIVE Final   Comment:        THE SENSITIVITY OF THIS METHODOLOGY IS >20 mIU/mL. Performed at [facility], [address], [place], [place] [address]    SARS Coronavirus 2 Ag 09/13/2020 Negative  Negative Final   SARSCOV2ONAVIRUS 2 AG 09/13/2020 NEGATIVE  NEGATIVE Final   Comment: (NOTE) SARS-CoV-2 antigen NOT DETECTED.   Negative results are presumptive.  Negative results do not preclude SARS-CoV-2 infection and should not be used as the sole basis for treatment or other patient management decisions, including infection  control decisions, particularly in the presence of clinical signs and  symptoms consistent with COVID-19, or in those who have been in contact with the virus.  Negative results must be combined with clinical observations, patient history, and epidemiological information. The expected result is Negative.  Fact Sheet  for Patients: https://www.jennings-kim.com/  Fact Sheet for  Healthcare Providers: https://alexander-rogers.biz/  This test is not yet approved or cleared by the [place] FDA and  has been authorized for detection and/or diagnosis of SARS-CoV-2 by FDA under an Emergency Use Authorization (EUA).  This EUA will remain in effect (meaning this test can be used) for the duration of  the COV                          ID-19 declaration under Section 564(b)(1) of the Act, 21 U.S.C. section 360bbb-3(b)(1), unless the authorization is terminated or revoked sooner.     Preg Test, Ur 09/13/2020 NEGATIVE  NEGATIVE Final   Comment:        THE SENSITIVITY OF THIS METHODOLOGY IS >24 mIU/mL     Blood Alcohol level:  Lab Results  Component Value Date   ETH 197 (H) 09/13/2020   ETH 372 (HH) 02/13/2019    Metabolic Disorder Labs: Lab Results  Component Value Date   HGBA1C 9.6 (H) 09/13/2020   MPG 228.82 09/13/2020   No results found for: PROLACTIN Lab Results  Component Value Date   CHOL 235 (H) 09/13/2020   TRIG 289 (H) 09/13/2020   HDL 79 09/13/2020   CHOLHDL 3.0 09/13/2020   VLDL 58 (H) 09/13/2020   LDLCALC 98 09/13/2020   LDLCALC 135 (H) 07/17/2017    Therapeutic Lab Levels: No results found for: LITHIUM No results found for: VALPROATE No components found for:  CBMZ  Physical Findings   PHQ2-9    Flowsheet Row ED from 09/13/2020 in [facility]  PHQ-2 Total Score 2  PHQ-9 Total Score 9      Flowsheet Row ED from 09/13/2020 in [facility] ED from 08/02/2020 in [facility]  C-SSRS RISK CATEGORY Moderate Risk No Risk        Musculoskeletal  Strength & Muscle Tone: within normal limits Gait & Station: normal Patient leans: N/A  Psychiatric Specialty Exam  Presentation  General Appearance: Appropriate for Environment  Eye Contact:Good  Speech:Clear and Coherent  Speech Volume:Normal  Handedness:Right   Mood and  Affect  Mood:Depressed  Affect:Congruent; Depressed   Thought Process  Thought Processes:Coherent  Descriptions of Associations:Intact  Orientation:Full (Time, Place and Person)  Thought Content:WDL  Diagnosis of Schizophrenia or Schizoaffective disorder in past: No    Hallucinations:Hallucinations: None  Ideas of Reference:None  Suicidal Thoughts:Suicidal Thoughts: No  Homicidal Thoughts:Homicidal Thoughts: No   Sensorium  Memory:Immediate Good; Recent Good; Remote Good  Judgment:Good  Insight:Good   Executive Functions  Concentration:Good  Attention Span:Good  Recall:Good  Fund of Knowledge:Good  Language:Good   Psychomotor Activity  Psychomotor Activity:Psychomotor Activity: Normal   Assets  Assets:Communication Skills; Desire for Improvement; Financial Resources/Insurance; Housing; Intimacy; Physical Health; Resilience; Transportation; Vocational/Educational   Sleep  Sleep:Sleep: Fair Number of Hours of Sleep: 7   No data recorded  Physical Exam  Physical Exam Vitals and nursing note reviewed.  Constitutional:      General: She is not in acute distress.    Appearance: Normal appearance. She is not ill-appearing.  HENT:     Head: Normocephalic.  Eyes:     Conjunctiva/sclera: Conjunctivae normal.  Cardiovascular:     Rate and Rhythm: Normal rate.  Pulmonary:     Effort: Pulmonary effort is normal.  Musculoskeletal:        General: Normal range of motion.     Cervical  back: Normal range of motion.  Skin:    Coloration: Skin is not jaundiced or pale.  Neurological:     Mental Status: She is alert and oriented to person, place, and time.  Psychiatric:        Attention and Perception: Attention and perception normal.        Mood and Affect: Mood is depressed.        Speech: Speech normal.        Behavior: Behavior normal. Behavior is cooperative.        Thought Content: Thought content normal.        Cognition and Memory: Cognition  normal.        Judgment: Judgment is impulsive.   Review of Systems  Constitutional: Negative.  Negative for fever.  HENT: Negative.  Negative for hearing loss.   Eyes: Negative.   Respiratory: Negative.  Negative for cough.   Cardiovascular: Negative.  Negative for chest pain.  Musculoskeletal: Negative.   Skin: Negative.   Neurological: Negative.   Psychiatric/Behavioral:  Positive for depression.   Blood pressure (!) 122/93, pulse 64, temperature 98 F (36.7 C), temperature source Oral, resp. rate 16, SpO2 99 %. There is no height or weight on file to calculate BMI.  Treatment Plan Summary: Daily contact with patient to assess and evaluate symptoms and progress in treatment and Medication management  Patient continues to meet criteria for treatment and that if the [facility] unit.  No medication changes at this time.  Ardis Hughs, NP 09/16/2020 10:54 AM

## 2020-09-16 NOTE — Progress Notes (Signed)
Pt is presently watching TV. No signs of acute distress noted. Pt's safety is maintained.

## 2020-09-17 LAB — GLUCOSE, CAPILLARY
Glucose-Capillary: 134 mg/dL — ABNORMAL HIGH (ref 70–99)
Glucose-Capillary: 199 mg/dL — ABNORMAL HIGH (ref 70–99)

## 2020-09-17 MED ORDER — ONDANSETRON 4 MG PO TBDP
4.0000 mg | ORAL_TABLET | Freq: Three times a day (TID) | ORAL | Status: DC | PRN
  Administered 2020-09-17: 4 mg via ORAL
  Filled 2020-09-17: qty 1

## 2020-09-17 NOTE — Group Therapy Note (Signed)
Wellness Friday - Wellness (8 Dimensions of Wellness)  Date: 09/17/20  Type of Therapy/Therapeutic Modalities: Psycho-Educational, Supportive, Motivational Interviewing; Holistic   Participation Level: Active  Objective: To challenge patients to reflect on their recovery process utilizing a holistic approach by reviewing the 8 Dimensions of Wellness to assist them in making positive changes in their lives by impacting the mind, body, and spirit. The 8 Dimensions of wellness include: emotional, occupational, physical, social, intellectual, and spiritual.   Therapeutic Goals:  1. Patient will learn about the different dimensions and what factors contribute to each respectively.  2. Patient will explore which of the 8 dimensions are personally exceptional and those that they may struggle maintaining due to various factors, including mental health issues.  3. Patient will discuss how they plans to enhance each of the 8 dimensions to achieve overall wellness.  Summary of Patient's Progress: Pt very engaged with group. Offered solutions and suggestions for herself and others. Stated she would like to become more active and work on physical and social wellness.

## 2020-09-17 NOTE — ED Notes (Signed)
CBG 199 

## 2020-09-17 NOTE — ED Notes (Signed)
Pt sleeping at present, no distress noted.  Monitoring for safety. 

## 2020-09-17 NOTE — ED Notes (Signed)
Pt sleeping in no acute distress. RR even and unlabored. Safety  Maintained. 

## 2020-09-17 NOTE — ED Notes (Signed)
Pt in group (Chaplin) in no acute distress. No complaints or needs voiced. Safety maintained.

## 2020-09-17 NOTE — Group Therapy Note (Signed)
Patient participated in group 100 percent by discussing her plans once she is discharged from the program. She also discussed how she will incorporate her coping skills once she is discharged.

## 2020-09-17 NOTE — ED Notes (Signed)
Patient A&Ox4. Denies intent to harm self/others when asked. Denies A/VH. Patient denies any physical complaints when asked. No acute distress noted. Support and encouragement provided. Routine safety checks conducted according to facility protocol. Encouraged patient to notify staff if thoughts of harm toward self or others arise. Patient verbalize understanding and agreement. Patient remains safe and patient verbally contracts for safety at this time. Will continue to monitor.  °   °

## 2020-09-17 NOTE — ED Notes (Signed)
Pt reports MOM effective in moving bowels. Informed pt to notify staff with any needs or concerns. Safety maintained.

## 2020-09-17 NOTE — ED Notes (Signed)
Zofran effective in managing nausea.

## 2020-09-17 NOTE — ED Provider Notes (Signed)
Behavioral Health Progress Note  Date and Time: 09/17/2020 11:27 AM Name: Tanya Wilson MRN:  [id number]  Subjective:    Tanya Wilson, 35 y.o., female with alcohol use, bipolar disorder who is currently admitted for alcoholism and depression.   Patient seen and chart reviewed. She has been medication compliant and attending groups. She describes her mood as "good" and reports sleeping well. She reports decreased appetite but attributes this to constipation and states that she has not had a BM since Monday. She also reports some nausea.  She states that she took some medication for constipation this AM. Discussed that she also has PRN medication available for nausea as well. She states that she has been attending groups and has found them helpful, describes coping mechanisms she has learned and expresses that she will use these outside of the hospital setting. Denies SI/HI/AVH. Discussed discharge plan with plan to discharge tomorrow. Pt verbalizes understanding and is in agreement with plan.    Diagnosis:  Final diagnoses:  Alcohol abuse, continuous  Alcohol dependence with alcohol-induced mood disorder (HCC)    Total Time spent with patient: 15 minutes  Past Psychiatric History: Depression, anxiety, bipolar disorder Past Medical History:  Past Medical History:  Diagnosis Date   Anxiety    Bipolar affective (HCC)    ETOH abuse    Reflux    No past surgical history on file. Family History: No family history on file. Family Psychiatric  History: Unknown Social History:  Social History   Substance and Sexual Activity  Alcohol Use Yes   Alcohol/week: 3.0 standard drinks   Types: 3 Cans of beer per week   Comment: last drink 5 days ago     Social History   Substance and Sexual Activity  Drug Use Not Currently   Types: Marijuana    Social History   Socioeconomic History   Marital status: Single    Spouse name: Not on file   Number of children: Not on file   Years  of education: Not on file   Highest education level: Not on file  Occupational History   Not on file  Tobacco Use   Smoking status: Some Days    Types: Cigarettes   Smokeless tobacco: Never  Substance and Sexual Activity   Alcohol use: Yes    Alcohol/week: 3.0 standard drinks    Types: 3 Cans of beer per week    Comment: last drink 5 days ago   Drug use: Not Currently    Types: Marijuana   Sexual activity: Yes    Comment: mirena  Other Topics Concern   Not on file  Social History Narrative   Not on file   Social Determinants of Health   Financial Resource Strain: Not on file  Food Insecurity: Not on file  Transportation Needs: Not on file  Physical Activity: Not on file  Stress: Not on file  Social Connections: Not on file   SDOH:  SDOH Screenings   Alcohol Screen: Not on file  Depression (PHQ2-9): Medium Risk   PHQ-2 Score: 9  Financial Resource Strain: Not on file  Food Insecurity: Not on file  Housing: Not on file  Physical Activity: Not on file  Social Connections: Not on file  Stress: Not on file  Tobacco Use: High Risk   Smoking Tobacco Use: Some Days   Smokeless Tobacco Use: Never  Transportation Needs: Not on file   Additional Social History:    Pain Medications: no Prescriptions: no Over the Counter: no  History of alcohol / drug use?: Yes Longest period of sobriety (when/how long): 5 months Jan.2013 to June 2013 Negative Consequences of Use: Work / Programmer, multimedia, Copywriter, advertising relationships Withdrawal Symptoms:  (none) Name of Substance 1: alcohol 1 - Age of First Use: unk 1 - Amount (size/oz): 2-4 malt beverages 1 - Frequency: 3 x week 1 - Duration: ongoing 1 - Last Use / Amount: 09/12/20 1 - Method of Aquiring: purchase 1- Route of Use: drinking    Sleep: Fair  Appetite:   decreased  Current Medications:  Current Facility-Administered Medications  Medication Dose Route Frequency Provider Last Rate Last Admin   acetaminophen (TYLENOL) tablet 650  mg  650 mg Oral Q6H PRN Ardis Hughs, NP   650 mg at 09/15/20 0915   alum & mag hydroxide-simeth (MAALOX/MYLANTA) 200-200-20 MG/5ML suspension 30 mL  30 mL Oral Q4H PRN Ardis Hughs, NP       ARIPiprazole (ABILIFY) tablet 2 mg  2 mg Oral Daily Vernard Gambles H, NP   2 mg at 09/17/20 7829   busPIRone (BUSPAR) tablet 5 mg  5 mg Oral TID Ardis Hughs, NP   5 mg at 09/17/20 5621   empagliflozin (JARDIANCE) tablet 10 mg  10 mg Oral Daily Ardis Hughs, NP   10 mg at 09/17/20 3086   And   linagliptin (TRADJENTA) tablet 5 mg  5 mg Oral Daily Ardis Hughs, NP   5 mg at 09/17/20 5784   hydrOXYzine (ATARAX/VISTARIL) tablet 25 mg  25 mg Oral TID PRN Jackelyn Poling, NP   25 mg at 09/16/20 2123   magnesium hydroxide (MILK OF MAGNESIA) suspension 30 mL  30 mL Oral Daily PRN Ardis Hughs, NP   30 mL at 09/17/20 0935   metFORMIN (GLUCOPHAGE) tablet 500 mg  500 mg Oral BID WC Ardis Hughs, NP   500 mg at 09/17/20 0900   multivitamin with minerals tablet 1 tablet  1 tablet Oral Daily Ardis Hughs, NP   1 tablet at 09/17/20 6962   thiamine tablet 100 mg  100 mg Oral Daily Ardis Hughs, NP   100 mg at 09/17/20 9528   traZODone (DESYREL) tablet 50 mg  50 mg Oral QHS PRN Ardis Hughs, NP   50 mg at 09/16/20 2058   Current Outpatient Medications  Medication Sig Dispense Refill   busPIRone (BUSPAR) 5 MG tablet Take 5 mg by mouth 3 (three) times daily.     GLYXAMBI 10-5 MG TABS Take 1 tablet by mouth daily.     metFORMIN (GLUCOPHAGE-XR) 500 MG 24 hr tablet Take 500 mg by mouth 2 (two) times daily.     ondansetron (ZOFRAN-ODT) 4 MG disintegrating tablet Take 4 mg by mouth every 8 (eight) hours as needed for nausea or vomiting.      Labs  Lab Results:  Admission on 09/13/2020  Component Date Value Ref Range Status   SARS Coronavirus 2 by RT PCR 09/13/2020 NEGATIVE  NEGATIVE Final   Comment: (NOTE) SARS-CoV-2 target nucleic acids are NOT DETECTED.  The  SARS-CoV-2 RNA is generally detectable in upper respiratory specimens during the acute phase of infection. The lowest concentration of SARS-CoV-2 viral copies this assay can detect is 138 copies/mL. A negative result does not preclude SARS-Cov-2 infection and should not be used as the sole basis for treatment or other patient management decisions. A negative result may occur with  improper specimen collection/handling, submission of specimen other than nasopharyngeal swab, presence  of viral mutation(s) within the areas targeted by this assay, and inadequate number of viral copies(<138 copies/mL). A negative result must be combined with clinical observations, patient history, and epidemiological information. The expected result is Negative.  Fact Sheet for Patients:  BloggerCourse.com  Fact Sheet for Healthcare Providers:  SeriousBroker.it  This test is no                          t yet approved or cleared by the [place] FDA and  has been authorized for detection and/or diagnosis of SARS-CoV-2 by FDA under an Emergency Use Authorization (EUA). This EUA will remain  in effect (meaning this test can be used) for the duration of the COVID-19 declaration under Section 564(b)(1) of the Act, 21 U.S.C.section 360bbb-3(b)(1), unless the authorization is terminated  or revoked sooner.       Influenza A by PCR 09/13/2020 NEGATIVE  NEGATIVE Final   Influenza B by PCR 09/13/2020 NEGATIVE  NEGATIVE Final   Comment: (NOTE) The Xpert Xpress SARS-CoV-2/FLU/RSV plus assay is intended as an aid in the diagnosis of influenza from Nasopharyngeal swab specimens and should not be used as a sole basis for treatment. Nasal washings and aspirates are unacceptable for Xpert Xpress SARS-CoV-2/FLU/RSV testing.  Fact Sheet for Patients: BloggerCourse.com  Fact Sheet for Healthcare  Providers: SeriousBroker.it  This test is not yet approved or cleared by the [place] FDA and has been authorized for detection and/or diagnosis of SARS-CoV-2 by FDA under an Emergency Use Authorization (EUA). This EUA will remain in effect (meaning this test can be used) for the duration of the COVID-19 declaration under Section 564(b)(1) of the Act, 21 U.S.C. section 360bbb-3(b)(1), unless the authorization is terminated or revoked.  Performed at [facility], [address], [place], [place] [address]    WBC 09/13/2020 6.7  4.0 - 10.5 K/uL Final   RBC 09/13/2020 4.52  3.87 - 5.11 MIL/uL Final   Hemoglobin 09/13/2020 15.5 (A) 12.0 - 15.0 g/dL Final   HCT 43/32/9518 44.2  36.0 - 46.0 % Final   MCV 09/13/2020 97.8  80.0 - 100.0 fL Final   MCH 09/13/2020 34.3 (A) 26.0 - 34.0 pg Final   MCHC 09/13/2020 35.1  30.0 - 36.0 g/dL Final   RDW 84/16/6063 13.2  11.5 - 15.5 % Final   Platelets 09/13/2020 308  150 - 400 K/uL Final   nRBC 09/13/2020 0.0  0.0 - 0.2 % Final   Neutrophils Relative % 09/13/2020 66  % Final   Neutro Abs 09/13/2020 4.4  1.7 - 7.7 K/uL Final   Lymphocytes Relative 09/13/2020 14  % Final   Lymphs Abs 09/13/2020 1.0  0.7 - 4.0 K/uL Final   Monocytes Relative 09/13/2020 17  % Final   Monocytes Absolute 09/13/2020 1.1 (A) 0.1 - 1.0 K/uL Final   Eosinophils Relative 09/13/2020 1  % Final   Eosinophils Absolute 09/13/2020 0.1  0.0 - 0.5 K/uL Final   Basophils Relative 09/13/2020 1  % Final   Basophils Absolute 09/13/2020 0.0  0.0 - 0.1 K/uL Final   Immature Granulocytes 09/13/2020 1  % Final   Abs Immature Granulocytes 09/13/2020 0.04  0.00 - 0.07 K/uL Final   Performed at [facility], [address], [place], [place] [address]   Sodium 09/13/2020 136  135 - 145 mmol/L Final   Potassium 09/13/2020 3.8  3.5 - 5.1 mmol/L Final   Chloride 09/13/2020 98  98 -  111 mmol/L Final   CO2 09/13/2020 22  22 - 32 mmol/L Final   Glucose,  Bld 09/13/2020 237 (A) 70 - 99 mg/dL Final   Glucose reference range applies only to samples taken after fasting for at least 8 hours.   BUN 09/13/2020 12  6 - 20 mg/dL Final   Creatinine, Ser 09/13/2020 0.70  0.44 - 1.00 mg/dL Final   Calcium 94/70/9628 9.3  8.9 - 10.3 mg/dL Final   Total Protein 36/62/9476 7.3  6.5 - 8.1 g/dL Final   Albumin 54/65/0354 4.1  3.5 - 5.0 g/dL Final   AST 65/68/1275 36  15 - 41 U/L Final   ALT 09/13/2020 24  0 - 44 U/L Final   Alkaline Phosphatase 09/13/2020 78  38 - 126 U/L Final   Total Bilirubin 09/13/2020 0.4  0.3 - 1.2 mg/dL Final   GFR, Estimated 09/13/2020 >60  >60 mL/min Final   Comment: (NOTE) Calculated using the CKD-EPI Creatinine Equation (2021)    Anion gap 09/13/2020 16 (A) 5 - 15 Final   Performed at [facility], [address] [place], [place] [address]   Hgb A1c MFr Bld 09/13/2020 9.6 (A) 4.8 - 5.6 % Final   Comment: (NOTE) Pre diabetes:          5.7%-6.4%  Diabetes:              >6.4%  Glycemic control for   <7.0% adults with diabetes    Mean Plasma Glucose 09/13/2020 228.82  mg/dL Final   Performed at [facility], [address] [place], [place] [address]   Alcohol, Ethyl (B) 09/13/2020 197 (A) <10 mg/dL Final   Comment: (NOTE) Lowest detectable limit for serum alcohol is 10 mg/dL.  For medical purposes only. Performed at [facility], [address], [place], [place] [address]    Cholesterol 09/13/2020 235 (A) 0 - 200 mg/dL Final   Triglycerides 16/38/4665 289 (A) <150 mg/dL Final   HDL 99/35/7017 79  >40 mg/dL Final   Total CHOL/HDL Ratio 09/13/2020 3.0  RATIO Final   VLDL 09/13/2020 58 (A) 0 - 40 mg/dL Final   LDL Cholesterol 09/13/2020 98  0 - 99 mg/dL Final   Comment:        Total Cholesterol/HDL:CHD Risk Coronary Heart Disease Risk Table                     Men   Women  1/2 Average Risk   3.4   3.3  Average Risk       5.0   4.4  2 X Average Risk   9.6   7.1  3 X Average Risk  23.4   11.0         Use the calculated Patient Ratio above and the CHD Risk Table to determine the patient's CHD Risk.        ATP III CLASSIFICATION (LDL):  <100     mg/dL   Optimal  793-903  mg/dL   Near or Above                    Optimal  130-159  mg/dL   Borderline  009-233  mg/dL   High  >007     mg/dL   Very High Performed at [facility], [address], [place], [place] [address]    TSH 09/13/2020 2.250  0.350 - 4.500 uIU/mL Final   Comment: Performed by a 3rd  Generation assay with a functional sensitivity of <=0.01 uIU/mL. Performed at [facility], [address], [place], [place] [address]    Color, Urine 09/13/2020 COLORLESS (A) YELLOW Final   APPearance 09/13/2020 CLEAR  CLEAR Final   Specific Gravity, Urine 09/13/2020 1.010  1.005 - 1.030 Final   pH 09/13/2020 6.0  5.0 - 8.0 Final   Glucose, UA 09/13/2020 >=500 (A) NEGATIVE mg/dL Final   Hgb urine dipstick 09/13/2020 NEGATIVE  NEGATIVE Final   Bilirubin Urine 09/13/2020 NEGATIVE  NEGATIVE Final   Ketones, ur 09/13/2020 NEGATIVE  NEGATIVE mg/dL Final   Protein, ur 47/82/9562 NEGATIVE  NEGATIVE mg/dL Final   Nitrite 13/08/6576 NEGATIVE  NEGATIVE Final   Leukocytes,Ua 09/13/2020 NEGATIVE  NEGATIVE Final   RBC / HPF 09/13/2020 0-5  0 - 5 RBC/hpf Final   Bacteria, UA 09/13/2020 NONE SEEN  NONE SEEN Final   Squamous Epithelial / LPF 09/13/2020 0-5  0 - 5 Final   Performed at [facility], [address], [place], [place] [address]   POC Amphetamine UR 09/13/2020 None Detected  NONE DETECTED (Cut Off Level 1000 ng/mL) Final   POC Secobarbital (BAR) 09/13/2020 None Detected  NONE DETECTED (Cut Off Level 300 ng/mL) Final   POC Buprenorphine (BUP) 09/13/2020 None Detected  NONE DETECTED (Cut Off Level 10 ng/mL) Final   POC Oxazepam (BZO) 09/13/2020 None Detected  NONE DETECTED (Cut Off Level 300 ng/mL) Final   POC Cocaine UR 09/13/2020 None Detected  NONE DETECTED (Cut Off Level 300 ng/mL) Final   POC Methamphetamine UR  09/13/2020 None Detected  NONE DETECTED (Cut Off Level 1000 ng/mL) Final   POC Morphine 09/13/2020 None Detected  NONE DETECTED (Cut Off Level 300 ng/mL) Final   POC Oxycodone UR 09/13/2020 None Detected  NONE DETECTED (Cut Off Level 100 ng/mL) Final   POC Methadone UR 09/13/2020 None Detected  NONE DETECTED (Cut Off Level 300 ng/mL) Final   POC Marijuana UR 09/13/2020 Positive (A) NONE DETECTED (Cut Off Level 50 ng/mL) Final   Preg Test, Ur 09/13/2020 NEGATIVE  NEGATIVE Final   Comment:        THE SENSITIVITY OF THIS METHODOLOGY IS >20 mIU/mL. Performed at [facility], [address], [place], [place] [address]    SARS Coronavirus 2 Ag 09/13/2020 Negative  Negative Final   SARSCOV2ONAVIRUS 2 AG 09/13/2020 NEGATIVE  NEGATIVE Final   Comment: (NOTE) SARS-CoV-2 antigen NOT DETECTED.   Negative results are presumptive.  Negative results do not preclude SARS-CoV-2 infection and should not be used as the sole basis for treatment or other patient management decisions, including infection  control decisions, particularly in the presence of clinical signs and  symptoms consistent with COVID-19, or in those who have been in contact with the virus.  Negative results must be combined with clinical observations, patient history, and epidemiological information. The expected result is Negative.  Fact Sheet for Patients: https://www.jennings-kim.com/  Fact Sheet for Healthcare Providers: https://alexander-rogers.biz/  This test is not yet approved or cleared by the [place] FDA and  has been authorized for detection and/or diagnosis of SARS-CoV-2 by FDA under an Emergency Use Authorization (EUA).  This EUA will remain in effect (meaning this test can be used) for the duration of  the COV                          ID-19 declaration under Section 564(b)(1) of the Act, 21 U.S.C. section 360bbb-3(b)(1),  unless the authorization is terminated or revoked  sooner.     Preg Test, Ur 09/13/2020 NEGATIVE  NEGATIVE Final   Comment:        THE SENSITIVITY OF THIS METHODOLOGY IS >24 mIU/mL     Blood Alcohol level:  Lab Results  Component Value Date   ETH 197 (H) 09/13/2020   ETH 372 (HH) 02/13/2019    Metabolic Disorder Labs: Lab Results  Component Value Date   HGBA1C 9.6 (H) 09/13/2020   MPG 228.82 09/13/2020   No results found for: PROLACTIN Lab Results  Component Value Date   CHOL 235 (H) 09/13/2020   TRIG 289 (H) 09/13/2020   HDL 79 09/13/2020   CHOLHDL 3.0 09/13/2020   VLDL 58 (H) 09/13/2020   LDLCALC 98 09/13/2020   LDLCALC 135 (H) 07/17/2017    Therapeutic Lab Levels: No results found for: LITHIUM No results found for: VALPROATE No components found for:  CBMZ  Physical Findings   PHQ2-9    Flowsheet Row ED from 09/13/2020 in [facility]  PHQ-2 Total Score 2  PHQ-9 Total Score 9      Flowsheet Row ED from 09/13/2020 in [facility] ED from 08/02/2020 in [facility]  C-SSRS RISK CATEGORY Moderate Risk No Risk        Musculoskeletal  Strength & Muscle Tone: within normal limits Gait & Station: normal Patient leans: N/A  Psychiatric Specialty Exam  Presentation  General Appearance: Appropriate for Environment; Casual  Eye Contact:Good  Speech:Clear and Coherent; Normal Rate  Speech Volume:Normal  Handedness:Right   Mood and Affect  Mood:Euthymic ("good")  Affect:Congruent; Appropriate   Thought Process  Thought Processes:Coherent; Goal Directed; Linear  Descriptions of Associations:Intact  Orientation:Full (Time, Place and Person)  Thought Content:WDL; Logical  Diagnosis of Schizophrenia or Schizoaffective disorder in past: No    Hallucinations:Hallucinations: None  Ideas of Reference:None  Suicidal Thoughts:Suicidal Thoughts: No  Homicidal Thoughts:Homicidal Thoughts: No   Sensorium   Memory:Immediate Good; Recent Good; Remote Good  Judgment:Good  Insight:Good   Executive Functions  Concentration:Good  Attention Span:Good  Recall:Good  Fund of Knowledge:Good  Language:Good   Psychomotor Activity  Psychomotor Activity:Psychomotor Activity: Normal   Assets  Assets:Communication Skills; Desire for Improvement; Financial Resources/Insurance; Housing; Intimacy; Physical Health; Resilience   Sleep  Sleep:Sleep: Fair   No data recorded  Physical Exam  Physical Exam Constitutional:      Appearance: Normal appearance. She is normal weight.  HENT:     Head: Normocephalic and atraumatic.  Eyes:     Extraocular Movements: Extraocular movements intact.  Pulmonary:     Effort: Pulmonary effort is normal.  Neurological:     Mental Status: She is alert and oriented to person, place, and time.   Review of Systems  Constitutional: Negative.  Negative for fever.  HENT: Negative.  Negative for hearing loss.   Eyes: Negative.   Respiratory: Negative.  Negative for cough.   Cardiovascular: Negative.  Negative for chest pain.  Gastrointestinal:  Positive for constipation.  Musculoskeletal: Negative.   Skin: Negative.   Neurological: Negative.   Psychiatric/Behavioral:  Positive for depression and substance abuse.   Blood pressure 115/80, pulse (!) 58, temperature 98.2 F (36.8 C), temperature source Oral, resp. rate 18, SpO2 100 %. There is no height or weight on file to calculate BMI.  Treatment Plan Summary: Daily contact with patient to assess and evaluate symptoms and progress in treatment and Medication management  Bipolar disorder  Anxiety -continue abilify 2 mg -continue buspar 5 mg TID  Diabetes -continue jardiance 10 mg and 5 mg tradjenta -continue metformin 500 mg BID  Alcohol use disorder -continue thiamine 100 mg daily -consider outpatient substance use treatment on discharge  See MAR for full ist of PRNS  Dispo: discharge  tomorrow to self care   Estella HuskKatherine S Treasa Bradshaw, MD 09/17/2020 11:27 AM

## 2020-09-17 NOTE — ED Notes (Signed)
Pt request Zofran for nausea. Medication given. Will continue to monitor for safety.

## 2020-09-17 NOTE — ED Notes (Signed)
Pt A&O x 4, no distress noted. Watching TV in Dining Room at present.  Monitoring for safety.  Pt calm & cooperative.

## 2020-09-18 LAB — GLUCOSE, CAPILLARY: Glucose-Capillary: 138 mg/dL — ABNORMAL HIGH (ref 70–99)

## 2020-09-18 MED ORDER — HYDROXYZINE HCL 25 MG PO TABS: 25.0000 mg | ORAL_TABLET | Freq: Three times a day (TID) | ORAL | 0 refills | Status: DC | PRN

## 2020-09-18 MED ORDER — ARIPIPRAZOLE 2 MG PO TABS: 2.0000 mg | ORAL_TABLET | Freq: Every day | ORAL | 0 refills | Status: DC

## 2020-09-18 NOTE — Progress Notes (Signed)
Patient resting in bed awake.  Continue to monitor for safety.

## 2020-09-18 NOTE — Discharge Instructions (Addendum)

## 2020-09-18 NOTE — ED Provider Notes (Signed)
FBC/OBS ASAP Discharge Summary  Date and Time: 09/18/2020 8:28 AM  Name: Tanya Wilson  MRN:  [id number]   Discharge Diagnoses:  Final diagnoses:  Alcohol abuse, continuous  Alcohol dependence with alcohol-induced mood disorder [facility])    Subjective:  Tanya Wilson, 35 y.o., female patient who initially presented to [facility] as a walk in and was admitted to the [facility] on 09/13/2020 for worsening depression, SI and requesting help with alcohol abuse.  Patient seen face to face by this provider, consulted with treatment team and Dr. Bronwen Betters; chart reviewed on 09/18/20.  Patient has a history of depression, anxiety, alcohol abuse and bipolar disorder.  On today's evaluation [name] is in sitting position.  She is fairly groomed and makes good eye contact.  Speech is clear, coherent, normal rate and tone.  She is alert/oriented x 4 and calm/cooperative. Her mood is euthymic with congruent affect.  Denies depression.  States she is a little sad to leave because she feels as though she has bonded with staff and will miss group sessions.  Patient states she is grateful for her time at the [facility]. States it has been very helpful.  Reports she feels stable to be discharged home. Her thought process is coherent and relevant; There is no indication that she is currently responding to internal/external stimuli or experiencing delusional thought content.  Patient denies suicidal/self-harm/homicidal ideation, psychosis, and paranoia.  Patient contracts for safety.  Denies access to firearms/weapons.  Patient has remained calm throughout assessment and has answered questions appropriately.    Collateral: Vista Lawman mother. States she has no immediate safety concerns with patient returning home.  Reports she is thankful for Neliah's time at the [facility].   Stay Summary:   [name] was admitted to [facility] unit for worsening depression, suicidal ideations, and crisis management.  She was treated with  the following medications Abilify, BuSpar, and hydralazine, which were tolerated with no adverse reactions. Cerys Winget was discharged with current medications and was instructed on how to take medications as prescribed; (details listed below under Medication List).  Printed prescriptions were provided for Abilify 2 mg p.o. daily and hydroxyzine 25 mg p.o. 3 times daily as needed.  Patient states she has refills on all other medications.  [name]'s improvement was monitored and her report of symptom reduction. Her emotional and mental status was also monitored by staff. She was evaluated for stability and plans for continued recovery upon discharge. The following was addressed as part of her discharge planning and follow up treatment:  Employment, housing, transportation, health status, family support, and any pending legal issues were also considered during her admission. Patient continues to states she has a court date on 9/2 for DUI.  States she feels better prepared to face the situation. She was offered further treatment options upon discharge including but not limited to Residential, Intensive Outpatient, Outpatient treatment, and rehabilitation services.  Patient states at this time she is not interested in any outpatient services for rehabilitation,  she may be interested after her court date.  Resources were provided.  [name] will follow up with the services as listed below under Follow up Information.     Upon completion of this admission the [facility] was both mentally and medically stable for discharge denying suicidal/homicidal ideation, auditory/visual/tactile hallucinations, delusional thoughts and paranoia.     Total Time spent with patient: 30 minutes  Past Psychiatric History: Depression, anxiety, bipolar disorder Past Medical History:  Past Medical History:  Diagnosis Date  Anxiety    Bipolar affective (HCC)    ETOH abuse    Reflux    No past  surgical history on file. Family History: No family history on file. Family Psychiatric History: Unknown Social History:  Social History   Substance and Sexual Activity  Alcohol Use Yes   Alcohol/week: 3.0 standard drinks   Types: 3 Cans of beer per week   Comment: last drink 5 days ago     Social History   Substance and Sexual Activity  Drug Use Not Currently   Types: Marijuana    Social History   Socioeconomic History   Marital status: Single    Spouse name: Not on file   Number of children: Not on file   Years of education: Not on file   Highest education level: Not on file  Occupational History   Not on file  Tobacco Use   Smoking status: Some Days    Types: Cigarettes   Smokeless tobacco: Never  Substance and Sexual Activity   Alcohol use: Yes    Alcohol/week: 3.0 standard drinks    Types: 3 Cans of beer per week    Comment: last drink 5 days ago   Drug use: Not Currently    Types: Marijuana   Sexual activity: Yes    Comment: mirena  Other Topics Concern   Not on file  Social History Narrative   Not on file   Social Determinants of Health   Financial Resource Strain: Not on file  Food Insecurity: Not on file  Transportation Needs: Not on file  Physical Activity: Not on file  Stress: Not on file  Social Connections: Not on file   SDOH:  SDOH Screenings   Alcohol Screen: Not on file  Depression (PHQ2-9): Medium Risk   PHQ-2 Score: 9  Financial Resource Strain: Not on file  Food Insecurity: Not on file  Housing: Not on file  Physical Activity: Not on file  Social Connections: Not on file  Stress: Not on file  Tobacco Use: High Risk   Smoking Tobacco Use: Some Days   Smokeless Tobacco Use: Never  Transportation Needs: Not on file    Tobacco Cessation:  N/A, patient does not currently use tobacco products  Current Medications:  Current Facility-Administered Medications  Medication Dose Route Frequency Provider Last Rate Last Admin    acetaminophen (TYLENOL) tablet 650 mg  650 mg Oral Q6H PRN Ardis Hughs, NP   650 mg at 09/15/20 0915   alum & mag hydroxide-simeth (MAALOX/MYLANTA) 200-200-20 MG/5ML suspension 30 mL  30 mL Oral Q4H PRN Ardis Hughs, NP       ARIPiprazole (ABILIFY) tablet 2 mg  2 mg Oral Daily Ardis Hughs, NP   2 mg at 09/17/20 6962   busPIRone (BUSPAR) tablet 5 mg  5 mg Oral TID Ardis Hughs, NP   5 mg at 09/17/20 2104   empagliflozin (JARDIANCE) tablet 10 mg  10 mg Oral Daily Ardis Hughs, NP   10 mg at 09/17/20 9528   And   linagliptin (TRADJENTA) tablet 5 mg  5 mg Oral Daily Ardis Hughs, NP   5 mg at 09/17/20 4132   hydrOXYzine (ATARAX/VISTARIL) tablet 25 mg  25 mg Oral TID PRN Jackelyn Poling, NP   25 mg at 09/17/20 2104   magnesium hydroxide (MILK OF MAGNESIA) suspension 30 mL  30 mL Oral Daily PRN Ardis Hughs, NP   30 mL at 09/17/20 0935   metFORMIN (  GLUCOPHAGE) tablet 500 mg  500 mg Oral BID WC Ardis Hughs, NP   500 mg at 09/17/20 1653   multivitamin with minerals tablet 1 tablet  1 tablet Oral Daily Ardis Hughs, NP   1 tablet at 09/17/20 0928   ondansetron (ZOFRAN-ODT) disintegrating tablet 4 mg  4 mg Oral Q8H PRN Estella Husk, MD   4 mg at 09/17/20 1718   thiamine tablet 100 mg  100 mg Oral Daily Ardis Hughs, NP   100 mg at 09/17/20 6160   traZODone (DESYREL) tablet 50 mg  50 mg Oral QHS PRN Ardis Hughs, NP   50 mg at 09/17/20 2104   Current Outpatient Medications  Medication Sig Dispense Refill   busPIRone (BUSPAR) 5 MG tablet Take 5 mg by mouth 3 (three) times daily.     GLYXAMBI 10-5 MG TABS Take 1 tablet by mouth daily.     metFORMIN (GLUCOPHAGE-XR) 500 MG 24 hr tablet Take 500 mg by mouth 2 (two) times daily.     ondansetron (ZOFRAN-ODT) 4 MG disintegrating tablet Take 4 mg by mouth every 8 (eight) hours as needed for nausea or vomiting.      PTA Medications: (Not in a hospital admission)   Musculoskeletal   Strength & Muscle Tone: within normal limits Gait & Station: normal Patient leans: N/A  Psychiatric Specialty Exam  Presentation  General Appearance: Appropriate for Environment; Casual  Eye Contact:Good  Speech:Clear and Coherent; Normal Rate  Speech Volume:Normal  Handedness:Right   Mood and Affect  Mood:Euthymic  Affect:Congruent   Thought Process  Thought Processes:Coherent  Descriptions of Associations:Intact  Orientation:Full (Time, Place and Person)  Thought Content:Logical  Diagnosis of Schizophrenia or Schizoaffective disorder in past: No    Hallucinations:Hallucinations: None  Ideas of Reference:None  Suicidal Thoughts:Suicidal Thoughts: No  Homicidal Thoughts:Homicidal Thoughts: No   Sensorium  Memory:Immediate Good; Recent Good; Remote Good  Judgment:Good  Insight:Good   Executive Functions  Concentration:Good  Attention Span:Good  Recall:Good  Fund of Knowledge:Good  Language:Good   Psychomotor Activity  Psychomotor Activity:Psychomotor Activity: Normal   Assets  Assets:Communication Skills; Desire for Improvement; Financial Resources/Insurance; Housing; Intimacy; Physical Health; Resilience; Social Support; Transportation; Vocational/Educational   Sleep  Sleep:Sleep: Fair Number of Hours of Sleep: 7   No data recorded  Physical Exam  Physical Exam Vitals and nursing note reviewed.  Constitutional:      General: She is not in acute distress.    Appearance: Normal appearance. She is not ill-appearing.  HENT:     Head: Normocephalic.  Eyes:     General:        Right eye: No discharge.        Left eye: No discharge.     Conjunctiva/sclera: Conjunctivae normal.  Cardiovascular:     Rate and Rhythm: Normal rate.  Pulmonary:     Effort: Pulmonary effort is normal. No respiratory distress.  Musculoskeletal:        General: Normal range of motion.     Cervical back: Normal range of motion.  Skin:    Coloration:  Skin is not jaundiced or pale.  Neurological:     Mental Status: She is alert and oriented to person, place, and time.  Psychiatric:        Attention and Perception: Attention and perception normal.        Mood and Affect: Affect normal.        Speech: Speech normal.        Behavior: Behavior  normal. Behavior is cooperative.        Thought Content: Thought content normal.        Cognition and Memory: Cognition normal.        Judgment: Judgment is impulsive.   Review of Systems  Constitutional: Negative.  Negative for fever.  HENT: Negative.  Negative for hearing loss.   Eyes: Negative.   Respiratory: Negative.  Negative for cough.   Cardiovascular: Negative.   Musculoskeletal: Negative.   Skin: Negative.   Neurological: Negative.   Psychiatric/Behavioral: Negative.    Blood pressure 117/90, pulse 69, temperature (!) 97.3 F (36.3 C), temperature source Oral, resp. rate 16, SpO2 100 %. There is no height or weight on file to calculate BMI.  Demographic Factors:  Caucasian  Loss Factors: NA  Historical Factors: Impulsivity  Risk Reduction Factors:   Responsible for children under 37 years of age, Sense of responsibility to family, Religious beliefs about death, Employed, Living with another person, especially a relative, Positive social support, Positive therapeutic relationship, and Positive coping skills or problem solving skills  Continued Clinical Symptoms:  Severe Anxiety and/or Agitation Bipolar Disorder:   Depressive phase Depression:   Comorbid alcohol abuse/dependence Impulsivity Alcohol/Substance Abuse/Dependencies  Cognitive Features That Contribute To Risk:  None    Suicide Risk:  Minimal: No identifiable suicidal ideation.  Patients presenting with no risk factors but with morbid ruminations; may be classified as minimal risk based on the severity of the depressive symptoms  Plan Of Care/Follow-up recommendations:  Activity:  as tolerated  Diet:   regular  Disposition: Discharge patient  Follow-up with outpatient resources provided by social worker which include: [place] treatment [facility], [facility], and [facility] for [facility].   Printed prescription for Abilify 2 mg p.o. daily and hydroxyzine 25 mg p.o. 3 times daily as needed provided.  Follow-up with outpatient psychiatric provider   No evidence of imminent risk to self or others at present.    Patient does not meet criteria for psychiatric inpatient admission. Discussed crisis plan, support from social network, calling 911, coming to the Emergency Department, and calling Suicide Hotline.   Ardis Hughsarolyn H Chaitanya Amedee, NP 09/18/2020, 8:28 AM

## 2020-09-18 NOTE — Progress Notes (Signed)
Given  breakfast

## 2020-09-18 NOTE — ED Provider Notes (Signed)
[facility] Discharge Suicide Risk Assessment   Principal Problem: <principal problem not specified> Discharge Diagnoses: Active Problems:   Alcohol dependence with alcohol-induced mood disorder (HCC)  Subjective:  On today's evaluation Tanya Wilson is in sitting position.  She is fairly groomed and makes good eye contact.  Speech is clear, coherent, normal rate and tone.  She is alert/oriented x 4 and calm/cooperative. Her mood is euthymic with congruent affect.  Denies depression.  States she is a little sad to leave because she feels as though she has bonded with staff and will miss group sessions.  Patient states she is grateful for her time at the [facility]. States it has been very helpful.  Reports she feels stable to be discharged home. Her thought process is coherent and relevant; There is no indication that she is currently responding to internal/external stimuli or experiencing delusional thought content.  Patient denies suicidal/self-harm/homicidal ideation, psychosis, and paranoia.  Patient contracts for safety.  Denies access to firearms/weapons.  Patient has remained calm throughout assessment and has answered questions appropriately.    Collateral: Vista Lawman mother. States she has no immediate safety concerns with patient returning home.  Reports she is thankful for Lillionna's time at the [facility].   Total Time spent with patient: 30 minutes  Musculoskeletal: Strength & Muscle Tone: within normal limits Gait & Station: normal Patient leans: N/A  Psychiatric Specialty Exam  Presentation  General Appearance: Appropriate for Environment; Casual  Eye Contact:Good  Speech:Clear and Coherent; Normal Rate  Speech Volume:Normal  Handedness:Right   Mood and Affect  Mood:Euthymic  Duration of Depression Symptoms: Greater than two weeks  Affect:Congruent   Thought Process  Thought Processes:Coherent  Descriptions of Associations:Intact  Orientation:Full (Time, Place and  Person)  Thought Content:Logical  History of Schizophrenia/Schizoaffective disorder:No  Duration of Psychotic Symptoms:No data recorded Hallucinations:Hallucinations: None  Ideas of Reference:None  Suicidal Thoughts:Suicidal Thoughts: No  Homicidal Thoughts:Homicidal Thoughts: No   Sensorium  Memory:Immediate Good; Recent Good; Remote Good  Judgment:Good  Insight:Good   Executive Functions  Concentration:Good  Attention Span:Good  Recall:Good  Fund of Knowledge:Good  Language:Good   Psychomotor Activity  Psychomotor Activity:Psychomotor Activity: Normal   Assets  Assets:Communication Skills; Desire for Improvement; Financial Resources/Insurance; Housing; Intimacy; Physical Health; Resilience; Social Support; Transportation; Vocational/Educational   Sleep  Sleep:Sleep: Fair Number of Hours of Sleep: 7   Physical Exam: Physical Exam-see discharge summary  ROSsee discharge summary  Blood pressure 117/90, pulse 69, temperature (!) 97.3 F (36.3 C), temperature source Oral, resp. rate 16, SpO2 100 %. There is no height or weight on file to calculate BMI.  Mental Status Per Nursing Assessment::  Per H&P admission note on 09/13/2020 On Admission:   Valine Drozdowski is a 35 y.o. female. patient who initially presented to [facility] as a walk in alone with complaints of "I need help with my depression and alcoholism". She was assessed by this Clinical research associate and agreed to be admitted to the [facility] unit for crisis stabilization.    [name], 35 y.o., female patient seen face to face by this provider, consulted with Dr. Bronwen Betters; and chart reviewed on 09/13/20.  On evaluation Consetta Cosner reports she has a long history of alcohol abuse.  She has two DUI's she received in 2021, states her court date for those DUIs are 09/24/2020.  States both are related to alcohol use.  She also had a miscarriage in 06/2019.  States over the past month her depression and anxiety has  increased.  States, "if I do  not get some help I am going to go to jail".  Reports a psychiatric history of alcohol use, anxiety, depression, and bipolar disorder.   During evaluation Yajaira Doffing is sitting position in no acute distress.  She is well-groomed and makes good eye contact.  She is alert/oriented x 4; cooperative.states that she is anxious to and depressed with congruent with affect.  She is speaking in a clear tone at moderate volume, and fast pace.  States she only sleeps 3 hours per night.  Has decreased appetite with a 20 pound weight loss over the past month.  Reports that she isolates at times and is quick to get irritable.  Her thought process is coherent and relevant; There is no indication that she is currently responding to internal/external stimuli or experiencing delusional thought content; and she has denied  self-harm/homicidal ideation, psychosis, and paranoia.  Patient endorses suicidal ideations.reports she has nonstop suicidal thoughts.  Denies any and intent, specific plan, or access to means.  Patient cannot contract for safety at this time.  States, "I just do not care anymore".  States that she had one suicide attempt last month when she was drinking she took 10 pills, she did not seek medical attention.  States she had one additional attempt years ago, also attempted overdose.  States she has had one inpatient psychiatric admission in  09/2011 at [facility].  Denies auditory and visual hallucinations.   States she drinks two 4 Loco's a day and at times she drinks until she is drunk.  States her last drink was at 4:00 PM today, 2 hours ago. States she has never had a seizure when she stops drinking.  States she has been clean for as long as 4 months in the past.  Reports the only time she had a seizure == was from medication that she had taken not from alcohol use or withdrawal.  Denies any withdrawal symptoms at this time.  Endorses marijuana use denies any other illegal  substance.  States she lives with her partner and her 71 year old son.  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      Reports she is type II diabetic.  States her current medications are BuSpar 5 mg p.o. 3 times daily, Zofran 4 mg p.o. every 8 hours as needed, Glyxambi 10-mg-5mg  PO QD, metformin 500 mg p.o. twice daily.  Patient's outpatient PCP is Dr. Everlene Other Patient answered questions appropriately.     [facility].  Demographic Factors:  Caucasian  Loss Factors: NA  Historical Factors: Impulsivity  Risk Reduction Factors:   Responsible for children under 11 years of age, Sense of responsibility to family, Religious beliefs about death, Employed, Living with another person, especially a relative, Positive social support, Positive therapeutic relationship, and Positive coping skills or problem solving skills  Continued Clinical Symptoms:  Severe Anxiety and/or Agitation Depression:   Comorbid alcohol abuse/dependence Impulsivity Alcohol/Substance Abuse/Dependencies  Cognitive Features That Contribute To Risk:  None    Suicide Risk:  Minimal: No identifiable suicidal ideation.  Patients presenting with no risk factors but with morbid ruminations; may be classified as minimal risk based on the severity of the depressive symptoms   Follow-up Information     [facility] Call on 09/15/2020.   Why: Please contact to inquire about treatment beds. Please be sure to have any discharge paperwork from this encounter, including a list of medications. Please call facility  for any additional questions and/or concerns. Contact information: [address] [place] [address] [phone]         [facility] Follow up.   Why: Option - Please contact to establish outpatient medication management and therapy services for continuity of care. Be sure to have any discharge paperwork, inlcuding a  list of medications. Please contact main office for any additional questions or concerns. Contact information: [address] [place] [address] [phone]         [facility] Follow up.   Why: Option - Please contact to establish outpatient medication management and therapy services for continuity of care. Be sure to have any discharge paperwork, inlcuding a list of medications. Please contact main office for any additional questions or concerns. Contact information: [address] [place], [place] [address]  Phone: [phone] [phone]                Plan Of Care/Follow-up recommendations:  Activity:  as tolerated  Diet:  regular  Discharge patient   Follow-up with outpatient resources provided by social worker which include: [place] [facility], [facility], and [facility] for [facility].    Printed prescription for Abilify 2 mg p.o. daily and hydroxyzine 25 mg p.o. 3 times daily as needed provided.   Follow-up with outpatient psychiatric provider   No evidence of imminent risk to self or others at present.    Patient does not meet criteria for psychiatric inpatient admission.  Discussed crisis plan, support from social network, calling 911, coming to the Emergency Department, and calling Suicide Hotline   Ardis Hughs, NP 09/18/2020, 8:29 AM

## 2020-09-18 NOTE — ED Notes (Signed)
Patient showering.  BG this AM.  Denied SI, HI, AVH.  Patient stated she's discharging home today.  Continue to monitor for safety.

## 2020-09-18 NOTE — Progress Notes (Signed)
Tanya Wilson safety plan was reviewed with her, discussed and her questions answered. She received a copy of her safety plan and a copy was placed in her chart. She received her AVS, letter to return to work and prescriptions. She retrieved her personal belongings and was escorted to her car without incident.

## 2020-09-18 NOTE — ED Notes (Signed)
Pt sleeping at present, no distress noted.  Monitoring for safety. 

## 2022-10-01 ENCOUNTER — Ambulatory Visit (HOSPITAL_COMMUNITY)
Admission: EM | Admit: 2022-10-01 | Discharge: 2022-10-01 | Disposition: A | Discharge status: Home / Self Care | Payer: No Typology Code available for payment source

## 2022-10-01 DIAGNOSIS — F4320 Adjustment disorder, unspecified: Secondary | ICD-10-CM

## 2022-10-01 NOTE — ED Provider Notes (Signed)
Behavioral Health Urgent Care Medical Screening Exam  Patient Name: Tanya Wilson MRN: [id number] Date of Evaluation: 10/01/22 Chief Complaint:  "I need a psychiatric evaluation for court" Diagnosis:  Final diagnoses:  Adjustment disorder, unspecified type    History of Present illness: Tanya Wilson 37 y.o., female patient presented to [facility] as a walk in   with a stated request for a psychiatric evaluation for use in court.  She has a history of alcohol use, anxiety, depression, and bipolar disorder.  Tanya Wilson, 37 y.o., female patient seen face to face by this provider, consulted with Dr. Jannifer Franklin; and chart reviewed on 10/01/22.  On evaluation Tanya Wilson reports she is fine and has no current mental health needs.  Patient states she just wants a psychiatric evaluation to use for court.  When it was explained that is not a service provided by the urgent care, patient asked for her records from her 2022 admission.  Patient was told to reach out to medical records Monday through Friday to request her records.    During evaluation Tanya Wilson is sitting in no acute distress. She is alert, oriented x 4, calm, cooperative and attentive.  Her mood is euthymic with congruent affect.  She has normal speech, and behavior.  Objectively there is no evidence of psychosis/mania or delusional thinking.  Patient is able to converse coherently, goal directed thoughts, no distractibility, or pre-occupation. She also denies suicidal/self-harm/homicidal ideation, psychosis, and paranoia.  Patient answered questions appropriately.    Patient is not requesting treatment.  She is not a danger to herself or others and she is not manic or showing any signs of psychosis.  She does not meet criteria for inpatient psychiatric hospitalization.    Flowsheet Row ED from 10/01/2022 in [facility] ED from 09/13/2020 in [facility] ED from  08/02/2020 in [facility] Emergency Department at [facility]  C-SSRS RISK CATEGORY No Risk Moderate Risk No Risk       Psychiatric Specialty Exam  Presentation  General Appearance:Appropriate for Environment  Eye Contact:Good  Speech:Clear and Coherent; Normal Rate  Speech Volume:Normal  Handedness:Right   Mood and Affect  Mood: Euthymic  Affect: Congruent   Thought Process  Thought Processes: Coherent  Descriptions of Associations:Intact  Orientation:Full (Time, Place and Person)  Thought Content:WDL  Diagnosis of Schizophrenia or Schizoaffective disorder in past: No data recorded  Hallucinations:None  Ideas of Reference:None  Suicidal Thoughts:No  Homicidal Thoughts:No   Sensorium  Memory: Immediate Good; Recent Good; Remote Good  Judgment: Good  Insight: Good   Executive Functions  Concentration: Good  Attention Span: Good  Recall: Good  Fund of Knowledge: Good  Language: Good   Psychomotor Activity  Psychomotor Activity: Normal   Assets  Assets: Communication Skills; Desire for Improvement; Financial Resources/Insurance; Housing; Social Support; Vocational/Educational; Transportation   Sleep  Sleep: Good  Number of hours:  7   Physical Exam: Physical Exam Vitals and nursing note reviewed.  Eyes:     Pupils: Pupils are equal, round, and reactive to light.  Pulmonary:     Effort: Pulmonary effort is normal.  Skin:    General: Skin is dry.  Neurological:     Mental Status: She is alert and oriented to person, place, and time.    Review of Systems  All other systems reviewed and are negative.  Blood pressure (!) 119/92, pulse 90, temperature 98.2 F (36.8 C), temperature source Oral, resp. rate 16, SpO2 98%. There  is no height or weight on file to calculate BMI.  Musculoskeletal: Strength & Muscle Tone: within normal limits Gait & Station: normal Patient leans: N/A   [facility] MSE Discharge  Disposition for Follow up and Recommendations: Based on my evaluation the patient does not appear to have an emergency medical or psychiatric condition. Patient is discharged.    Thomes Lolling, NP 10/01/2022, 3:35 PM

## 2022-10-01 NOTE — Progress Notes (Signed)
   10/01/22 1510  [facility] Triage Screening (Walk-ins at [facility] only)  What Is the Reason for Your Visit/Call Today? Pt presents voluntarily to [facility] unaccompanied by anyone. Pt states that she is in need of a mental evaluation for court. Pt denies SI, HI, and AVH at this present time. Pt states that used a bowl size amount of marijuana last time for medical purposes, so that she could eat and not feel nausea.  How Long Has This Been Causing You Problems? <Week  Have You Recently Had Any Thoughts About Hurting Yourself? No  Are You Planning to Commit Suicide/Harm Yourself At This time? No  Have you Recently Had Thoughts About Hurting Someone [name]? No  Are You Planning To Harm Someone At This Time? No  Are you currently experiencing any auditory, visual or other hallucinations? No  Have You Used Any Alcohol or Drugs in the Past 24 Hours? Yes  How long ago did you use Drugs or Alcohol? last night  What Did You Use and How Much? marijuana (a bowl)  Do you have any current medical co-morbidities that require immediate attention? No  Clinician description of patient physical appearance/behavior: casually dressed, calm, cooperative  What Do You Feel Would Help You the Most Today? Social Support  If access to [facility] was not available, would you have sought care in the Emergency Department? No  Determination of Need Routine (7 days)  Options For Referral Other: Comment

## 2022-11-10 ENCOUNTER — Other Ambulatory Visit: Payer: Self-pay

## 2022-11-10 ENCOUNTER — Encounter (HOSPITAL_BASED_OUTPATIENT_CLINIC_OR_DEPARTMENT_OTHER): Payer: Self-pay

## 2022-11-10 ENCOUNTER — Inpatient Hospital Stay (HOSPITAL_BASED_OUTPATIENT_CLINIC_OR_DEPARTMENT_OTHER)
Admission: EM | Admit: 2022-11-10 | Discharge: 2022-11-12 | DRG: 638 | Disposition: A | Discharge status: Home / Self Care | Payer: No Typology Code available for payment source | Attending: Internal Medicine | Admitting: Internal Medicine

## 2022-11-10 DIAGNOSIS — Z880 Allergy status to penicillin: Secondary | ICD-10-CM | POA: Diagnosis not present

## 2022-11-10 DIAGNOSIS — F319 Bipolar disorder, unspecified: Secondary | ICD-10-CM | POA: Diagnosis present

## 2022-11-10 DIAGNOSIS — R1110 Vomiting, unspecified: Secondary | ICD-10-CM | POA: Diagnosis not present

## 2022-11-10 DIAGNOSIS — N390 Urinary tract infection, site not specified: Secondary | ICD-10-CM | POA: Diagnosis present

## 2022-11-10 DIAGNOSIS — F17210 Nicotine dependence, cigarettes, uncomplicated: Secondary | ICD-10-CM | POA: Diagnosis present

## 2022-11-10 DIAGNOSIS — E1110 Type 2 diabetes mellitus with ketoacidosis without coma: Principal | ICD-10-CM | POA: Diagnosis present

## 2022-11-10 DIAGNOSIS — F419 Anxiety disorder, unspecified: Secondary | ICD-10-CM | POA: Diagnosis present

## 2022-11-10 DIAGNOSIS — F1010 Alcohol abuse, uncomplicated: Secondary | ICD-10-CM | POA: Diagnosis present

## 2022-11-10 DIAGNOSIS — Z7984 Long term (current) use of oral hypoglycemic drugs: Secondary | ICD-10-CM

## 2022-11-10 DIAGNOSIS — N3000 Acute cystitis without hematuria: Secondary | ICD-10-CM | POA: Diagnosis present

## 2022-11-10 DIAGNOSIS — Z720 Tobacco use: Secondary | ICD-10-CM

## 2022-11-10 DIAGNOSIS — E871 Hypo-osmolality and hyponatremia: Secondary | ICD-10-CM | POA: Diagnosis present

## 2022-11-10 DIAGNOSIS — N179 Acute kidney failure, unspecified: Secondary | ICD-10-CM | POA: Diagnosis present

## 2022-11-10 DIAGNOSIS — F1020 Alcohol dependence, uncomplicated: Secondary | ICD-10-CM | POA: Diagnosis present

## 2022-11-10 DIAGNOSIS — R109 Unspecified abdominal pain: Secondary | ICD-10-CM | POA: Diagnosis present

## 2022-11-10 DIAGNOSIS — E8729 Other acidosis: Secondary | ICD-10-CM

## 2022-11-10 DIAGNOSIS — Z79899 Other long term (current) drug therapy: Secondary | ICD-10-CM

## 2022-11-10 LAB — COMPREHENSIVE METABOLIC PANEL
ALT: 40 U/L (ref 0–44)
AST: 41 U/L (ref 15–41)
Albumin: 5.3 g/dL — ABNORMAL HIGH (ref 3.5–5.0)
Alkaline Phosphatase: 131 U/L — ABNORMAL HIGH (ref 38–126)
BUN: 34 mg/dL — ABNORMAL HIGH (ref 6–20)
CO2: <7 mmol/L — ABNORMAL LOW (ref 22–32)
Calcium: 9 mg/dL (ref 8.9–10.3)
Chloride: 92 mmol/L — ABNORMAL LOW (ref 98–111)
Creatinine, Ser: 1.57 mg/dL — ABNORMAL HIGH (ref 0.44–1.00)
GFR, Estimated: 43 mL/min — ABNORMAL LOW (ref >60)
Glucose, Bld: 268 mg/dL — ABNORMAL HIGH (ref 70–99)
Potassium: 4.1 mmol/L (ref 3.5–5.1)
Sodium: 130 mmol/L — ABNORMAL LOW (ref 135–145)
Total Bilirubin: 1.8 mg/dL — ABNORMAL HIGH (ref 0.3–1.2)
Total Protein: 10.1 g/dL — ABNORMAL HIGH (ref 6.5–8.1)

## 2022-11-10 LAB — CBG MONITORING, ED
Glucose-Capillary: 268 mg/dL — ABNORMAL HIGH (ref 70–99)
Glucose-Capillary: 259 mg/dL — ABNORMAL HIGH (ref 70–99)
Glucose-Capillary: 209 mg/dL — ABNORMAL HIGH (ref 70–99)

## 2022-11-10 LAB — I-STAT VENOUS BLOOD GAS, ED
Acid-base deficit: 29 mmol/L — ABNORMAL HIGH (ref 0.0–2.0)
Bicarbonate: 3.6 mmol/L — ABNORMAL LOW (ref 20.0–28.0)
Calcium, Ion: 1.13 mmol/L — ABNORMAL LOW (ref 1.15–1.40)
HCT: 51 % — ABNORMAL HIGH (ref 36.0–46.0)
Hemoglobin: 17.3 g/dL — ABNORMAL HIGH (ref 12.0–15.0)
O2 Saturation: 77 %
Patient temperature: 98.4
Potassium: 4.6 mmol/L (ref 3.5–5.1)
Sodium: 130 mmol/L — ABNORMAL LOW (ref 135–145)
TCO2: <5 mmol/L — ABNORMAL LOW (ref 22–32)
pCO2, Ven: 18.9 mm[Hg] — CL (ref 44–60)
pH, Ven: 6.884 — CL (ref 7.25–7.43)
pO2, Ven: 68 mm[Hg] — ABNORMAL HIGH (ref 32–45)

## 2022-11-10 LAB — URINALYSIS, MICROSCOPIC (REFLEX)

## 2022-11-10 LAB — CBC
HCT: 49.8 % — ABNORMAL HIGH (ref 36.0–46.0)
Hemoglobin: 16.8 g/dL — ABNORMAL HIGH (ref 12.0–15.0)
MCH: 35.3 pg — ABNORMAL HIGH (ref 26.0–34.0)
MCHC: 33.7 g/dL (ref 30.0–36.0)
MCV: 104.6 fL — ABNORMAL HIGH (ref 80.0–100.0)
Platelets: 456 10*3/uL — ABNORMAL HIGH (ref 150–400)
RBC: 4.76 MIL/uL (ref 3.87–5.11)
RDW: 12 % (ref 11.5–15.5)
WBC: 13.3 10*3/uL — ABNORMAL HIGH (ref 4.0–10.5)
nRBC: 0 % (ref 0.0–0.2)

## 2022-11-10 LAB — BETA-HYDROXYBUTYRIC ACID: Beta-Hydroxybutyric Acid: >8 mmol/L — ABNORMAL HIGH (ref 0.05–0.27)

## 2022-11-10 LAB — URINALYSIS, ROUTINE W REFLEX MICROSCOPIC
Glucose, UA: >=500 mg/dL — AB
Ketones, ur: >=80 mg/dL — AB
Leukocytes,Ua: NEGATIVE
Nitrite: NEGATIVE
Protein, ur: >=300 mg/dL — AB
Specific Gravity, Urine: >=1.03 (ref 1.005–1.030)
pH: 5 (ref 5.0–8.0)

## 2022-11-10 LAB — BASIC METABOLIC PANEL
BUN: 33 mg/dL — ABNORMAL HIGH (ref 6–20)
CO2: <7 mmol/L — ABNORMAL LOW (ref 22–32)
Calcium: 8.8 mg/dL — ABNORMAL LOW (ref 8.9–10.3)
Chloride: 93 mmol/L — ABNORMAL LOW (ref 98–111)
Creatinine, Ser: 1.37 mg/dL — ABNORMAL HIGH (ref 0.44–1.00)
GFR, Estimated: 50 mL/min — ABNORMAL LOW (ref >60)
Glucose, Bld: 241 mg/dL — ABNORMAL HIGH (ref 70–99)
Potassium: 4.8 mmol/L (ref 3.5–5.1)
Sodium: 132 mmol/L — ABNORMAL LOW (ref 135–145)

## 2022-11-10 LAB — PREGNANCY, URINE: Preg Test, Ur: NEGATIVE

## 2022-11-10 LAB — LIPASE, BLOOD: Lipase: 85 U/L — ABNORMAL HIGH (ref 11–51)

## 2022-11-10 LAB — GLUCOSE, CAPILLARY: Glucose-Capillary: 102 mg/dL — ABNORMAL HIGH (ref 70–99)

## 2022-11-10 MED ORDER — LACTATED RINGERS IV SOLN: INTRAVENOUS | Status: DC

## 2022-11-10 MED ORDER — INSULIN REGULAR(HUMAN) IN NACL 100-0.9 UT/100ML-% IV SOLN
INTRAVENOUS | Status: DC
  Administered 2022-11-10: 6 [IU]/h via INTRAVENOUS
  Filled 2022-11-10: qty 100

## 2022-11-10 MED ORDER — DEXTROSE 5 % AND 0.9 % NACL IV BOLUS
1000.0000 mL | Freq: Once | INTRAVENOUS | Status: AC
  Administered 2022-11-10: 1000 mL via INTRAVENOUS

## 2022-11-10 MED ORDER — FOLIC ACID 1 MG PO TABS
1.0000 mg | ORAL_TABLET | Freq: Every day | ORAL | Status: DC
  Administered 2022-11-11 – 2022-11-12 (×3): 1 mg via ORAL
  Filled 2022-11-10 (×3): qty 1

## 2022-11-10 MED ORDER — DIAZEPAM 5 MG/ML IJ SOLN
5.0000 mg | Freq: Once | INTRAMUSCULAR | Status: AC
  Administered 2022-11-10: 5 mg via INTRAVENOUS
  Filled 2022-11-10: qty 2

## 2022-11-10 MED ORDER — THIAMINE HCL 100 MG/ML IJ SOLN
100.0000 mg | Freq: Every day | INTRAMUSCULAR | Status: DC
  Administered 2022-11-11: 100 mg via INTRAVENOUS
  Filled 2022-11-10 (×3): qty 2

## 2022-11-10 MED ORDER — ONDANSETRON HCL 4 MG/2ML IJ SOLN
4.0000 mg | Freq: Once | INTRAMUSCULAR | Status: AC | PRN
  Administered 2022-11-10: 4 mg via INTRAVENOUS
  Filled 2022-11-10: qty 2

## 2022-11-10 MED ORDER — ORAL CARE MOUTH RINSE
15.0000 mL | OROMUCOSAL | Status: DC | PRN
  Filled 2022-11-10: qty 15

## 2022-11-10 MED ORDER — LACTATED RINGERS IV BOLUS
20.0000 mL/kg | Freq: Once | INTRAVENOUS | Status: AC
  Administered 2022-11-10: 1000 mL via INTRAVENOUS

## 2022-11-10 MED ORDER — LACTATED RINGERS IV BOLUS
1000.0000 mL | Freq: Once | INTRAVENOUS | Status: AC
  Administered 2022-11-10: 1000 mL via INTRAVENOUS

## 2022-11-10 MED ORDER — POTASSIUM CHLORIDE 10 MEQ/100ML IV SOLN
10.0000 meq | INTRAVENOUS | Status: AC
  Administered 2022-11-10 (×2): 10 meq via INTRAVENOUS
  Filled 2022-11-10 (×2): qty 100

## 2022-11-10 MED ORDER — SODIUM CHLORIDE 0.9 % IV SOLN: INTRAVENOUS | Status: AC | PRN

## 2022-11-10 MED ORDER — THIAMINE MONONITRATE 100 MG PO TABS
100.0000 mg | ORAL_TABLET | Freq: Every day | ORAL | Status: DC
  Administered 2022-11-11 – 2022-11-12 (×2): 100 mg via ORAL
  Filled 2022-11-10 (×3): qty 1

## 2022-11-10 MED ORDER — LORAZEPAM 1 MG PO TABS
1.0000 mg | ORAL_TABLET | ORAL | Status: DC | PRN
  Administered 2022-11-11: 1 mg via ORAL
  Filled 2022-11-10: qty 1

## 2022-11-10 MED ORDER — DEXTROSE IN LACTATED RINGERS 5 % IV SOLN: INTRAVENOUS | Status: DC

## 2022-11-10 MED ORDER — LORAZEPAM 2 MG/ML IJ SOLN
1.0000 mg | INTRAMUSCULAR | Status: DC | PRN
  Administered 2022-11-11: 1 mg via INTRAVENOUS
  Filled 2022-11-10: qty 1

## 2022-11-10 MED ORDER — DEXTROSE 50 % IV SOLN: 0.0000 mL | INTRAVENOUS | Status: DC | PRN

## 2022-11-10 MED ORDER — ADULT MULTIVITAMIN W/MINERALS CH
1.0000 | ORAL_TABLET | Freq: Every day | ORAL | Status: DC
  Administered 2022-11-11 – 2022-11-12 (×3): 1 via ORAL
  Filled 2022-11-10 (×3): qty 1

## 2022-11-10 NOTE — Plan of Care (Signed)
Discussed with patient plan of care for the evening, pain management and admission questions with some teach back displayed.  What is important to the patient is rest and getting off the insulin drip.  Problem: Education: Goal: Knowledge of General Education information will improve Description: Including pain rating scale, medication(s)/side effects and non-pharmacologic comfort measures Outcome: Progressing   Problem: Health Behavior/Discharge Planning: Goal: Ability to manage health-related needs will improve Outcome: Progressing   Problem: Pain Managment: Goal: General experience of comfort will improve Outcome: Progressing

## 2022-11-10 NOTE — H&P (Signed)
PCP:   Tracey Harries, MD   Chief Complaint:  Nausea, vomiting, abdominal discomfort  HPI: This is a 37 year old female with past medical history of alcohol use, tobacco use, daily THC use, and bipolar disorder.  On Tuesday night the patient started feeling a bit off.  She started initial finding it felt harder to breathe, then she developed nausea and vomiting.  On Wednesday her nausea vomiting became progressively worse, she was unable to keep anything down.  She reports chills but no fever.  On Thursday she felt presyncopal each time she moved.  By Friday she a headache and vision changes.  Throughout all of this her shortness of breath and difficulty breathing remain and progressed. As stated the patient has diabetes mellitus, she states her fingerstick blood sugar runs on average 400-500.  She states she is compliant with diabetic diet but she does not check her sugars.  She drinks alcohol twice daily, malt liquor.  Her last drink was Tuesday.  She smokes 1 pack/day.  The patient went to [facility], her vitals 154/98, 124, 28, 97.4.  Satting 100% on room air.  Venous pH 6.88, sodium 130, CO2<7, glucose 268, BNP 34, creatinine 1.57, T. bili 1.8, WBC 13.3, beta hydroxybutyric acid>9 Endo tool for DKA was initiated.  Patient accepted in transfer to [facility]  Review of Systems:  Per HPI  Past Medical History: Past Medical History:  Diagnosis Date   Anxiety    Bipolar affective (HCC)    ETOH abuse    Reflux    History reviewed. No pertinent surgical history.  Medications: Prior to Admission medications   Medication Sig Start Date End Date Taking? Authorizing Provider  ARIPiprazole (ABILIFY) 2 MG tablet Take 1 tablet (2 mg total) by mouth daily. 09/18/20   Ardis Hughs, NP  busPIRone (BUSPAR) 5 MG tablet Take 5 mg by mouth 3 (three) times daily. 09/11/20   [provider]  GLYXAMBI 10-5 MG TABS Take 1 tablet by mouth daily. 08/19/20   [provider]  hydrOXYzine  (ATARAX/VISTARIL) 25 MG tablet Take 1 tablet (25 mg total) by mouth 3 (three) times daily as needed for anxiety. 09/18/20   Ardis Hughs, NP  metFORMIN (GLUCOPHAGE-XR) 500 MG 24 hr tablet Take 500 mg by mouth 2 (two) times daily. 08/19/20   [provider]  ondansetron (ZOFRAN-ODT) 4 MG disintegrating tablet Take 4 mg by mouth every 8 (eight) hours as needed for nausea or vomiting. 08/18/20   [provider]    Allergies:   Allergies  Allergen Reactions   Penicillins Hives    Social History:  reports that she has been smoking cigarettes. She has never used smokeless tobacco. She reports current alcohol use of about 3.0 standard drinks of alcohol per week. She reports that she does not currently use drugs after having used the following drugs: Marijuana.  Family History: History reviewed. No pertinent family history.  Physical Exam: Vitals:   11/10/22 2130 11/10/22 2200 11/10/22 2306 11/10/22 2312  BP: (!) 114/95 121/82  (!) 153/106  Pulse: (!) 123 (!) 117  (!) 106  Resp: (!) 25 (!) 25  (!) 30  Temp:  (!) 95.2 F (35.1 C)  97.7 F (36.5 C)  TempSrc:  Tympanic  Oral  SpO2: 100% 100%  100%  Weight:   54.9 kg   Height:   5' 4.02" (1.626 m)     General:  A&O x3, well developed and nourished, no acute distress Eyes: Pink conjunctiva, no scleral  icterus ENT: Moist oral mucosa, neck supple, no thyromegaly Lungs: clear to ascultation, no wheeze, no crackles, no use of accessory muscles Cardiovascular: Tachycardia, RRR, no regurgitation, no gallops Abdomen: soft, positive BS, mild nonspecific generalized TTP, not an acute abdomen GU: not examined Neuro: CN II - XII grossly intact, sensation intact Musculoskeletal: strength 5/5 all extremities, no edema Skin: no rash, no subcutaneous crepitation, no decubitus Psych: appropriate patient   Labs on Admission:  Recent Labs    11/10/22 1840 11/10/22 2030 11/10/22 2032  NA 130* 132* 130*  K 4.1 4.8 4.6  CL  92* 93*  --   CO2 <7* <7*  --   GLUCOSE 268* 241*  --   BUN 34* 33*  --   CREATININE 1.57* 1.37*  --   CALCIUM 9.0 8.8*  --    Recent Labs    11/10/22 1840  AST 41  ALT 40  ALKPHOS 131*  BILITOT 1.8*  PROT 10.1*  ALBUMIN 5.3*   Recent Labs    11/10/22 1840  LIPASE 85*   Recent Labs    11/10/22 1840 11/10/22 2032  WBC 13.3*  --   HGB 16.8* 17.3*  HCT 49.8* 51.0*  MCV 104.6*  --   PLT 456*  --     Radiological Exams on Admission: No results found.  Assessment/Plan Present on Admission:  DKA (diabetic ketoacidosis) (HCC) -Endo tool initiated -Aggressive IV fluid hydration, BMP, magnesium, phosphorus levels to 4 hours -Diabetic educator consulted. -Patient maintained on metformin and glyxambi to control her diabetes mellitus.  Both on hold -Per patient this is her first episode of DKA   Alcohol abuse, continuous -CIWA protocol initiated   Tobacco abuse -Nicotine patch ordered   THC use -daily, aware   Bipolar 1  Lakie Mclouth 11/10/2022, 11:25 PM

## 2022-11-10 NOTE — ED Triage Notes (Addendum)
The patient has been having vomiting for three days. Now having abd pain. No fever. She is diabetic but has not been checking her blood sugar at home. She does drink etoh every day. Last drink was Tuesday.

## 2022-11-10 NOTE — ED Provider Notes (Signed)
[place] EMERGENCY DEPARTMENT AT [facility] Provider Note   CSN: [id number] Arrival date & time: 11/10/22  1819     History Chief Complaint  Patient presents with   Emesis   Abdominal Pain    HPI Tanya Wilson is a 37 y.o. female presenting for 3 days of abdominal pain and intolerance p.o. intake.  History of alcohol use disorder.  History of alcoholic pancreatitis.  States that she last drank on Tuesday started developing intolerance p.o. intake on Wednesday and has had complete p.o. intolerance in the interim.  Denies fevers chills syncope shortness of breath otherwise.  Is having some epigastric abdominal pain..   Patient's recorded medical, surgical, social, medication list and allergies were reviewed in the Snapshot window as part of the initial history.   Review of Systems   Review of Systems  Constitutional:  Negative for chills and fever.  HENT:  Negative for ear pain and sore throat.   Eyes:  Negative for pain and visual disturbance.  Respiratory:  Negative for cough and shortness of breath.   Cardiovascular:  Negative for chest pain and palpitations.  Gastrointestinal:  Positive for abdominal pain, nausea and vomiting.  Genitourinary:  Negative for dysuria and hematuria.  Musculoskeletal:  Negative for arthralgias and back pain.  Skin:  Negative for color change and rash.  Neurological:  Negative for seizures and syncope.  All other systems reviewed and are negative.   Physical Exam Updated Vital Signs BP 121/82   Pulse (!) 117   Temp (!) 95.2 F (35.1 C) (Tympanic) Comment: EDP Tanya Wilson made aware  Resp (!) 25   Ht 5\' 4"  (1.626 m)   Wt 52.6 kg   LMP 11/10/2022   SpO2 100%   BMI 19.91 kg/m  Physical Exam Vitals and nursing note reviewed.  Constitutional:      General: She is not in acute distress.    Appearance: She is well-developed. She is not ill-appearing or toxic-appearing.  HENT:     Head: Normocephalic and atraumatic.   Eyes:     Extraocular Movements: Extraocular movements intact.     Conjunctiva/sclera: Conjunctivae normal.     Pupils: Pupils are equal, round, and reactive to light.  Cardiovascular:     Rate and Rhythm: Regular rhythm. Tachycardia present.     Heart sounds: No murmur heard. Pulmonary:     Effort: Pulmonary effort is normal. No respiratory distress.     Breath sounds: Normal breath sounds.  Abdominal:     General: Abdomen is flat. There is no distension.     Palpations: Abdomen is soft.     Tenderness: There is abdominal tenderness in the epigastric area, periumbilical area and left upper quadrant. There is no right CVA tenderness or left CVA tenderness.  Musculoskeletal:        General: No swelling, tenderness, deformity or signs of injury. Normal range of motion.     Cervical back: Normal range of motion and neck supple. No rigidity.  Skin:    General: Skin is warm and dry.  Neurological:     General: No focal deficit present.     Mental Status: She is alert and oriented to person, place, and time. Mental status is at baseline.     Cranial Nerves: No cranial nerve deficit.  Psychiatric:        Mood and Affect: Mood normal.      ED Course/ Medical Decision Making/ A&P Clinical Course as of 11/10/22 2244  Fri Nov 10, 2022  2104 Declined toxic alcohol ingestion. [CC]  2108 Mannam with CCM felt ok with [facility] admission to PCU [CC]    Clinical Course User Index [CC] Tanya Ade, MD    Procedures .Critical Care  Performed by: Tanya Ade, MD Authorized by: Tanya Ade, MD   Critical care provider statement:    Critical care time (minutes):  95   Critical care was necessary to treat or prevent imminent or life-threatening deterioration of the following conditions:  Metabolic crisis and circulatory failure   Critical care was time spent personally by me on the following activities:  Development of treatment plan with patient or surrogate, discussions with  consultants, evaluation of patient's response to treatment, examination of patient, ordering and review of laboratory studies, ordering and review of radiographic studies, ordering and performing treatments and interventions, pulse oximetry, re-evaluation of patient's condition and review of old charts    Medications Ordered in ED Medications  insulin regular, human (MYXREDLIN) 100 units/ 100 mL infusion (3 Units/hr Intravenous Rate/Dose Change 11/10/22 2204)  lactated ringers infusion (0 mLs Intravenous Hold 11/10/22 2115)  dextrose 50 % solution 0-50 mL (has no administration in time range)  potassium chloride 10 mEq in 100 mL IVPB (10 mEq Intravenous New Bag/Given 11/10/22 2146)  ondansetron (ZOFRAN) injection 4 mg (4 mg Intravenous Given 11/10/22 1850)  diazepam (VALIUM) injection 5 mg (5 mg Intravenous Given 11/10/22 1851)  lactated ringers bolus 1,000 mL (0 mLs Intravenous Stopped 11/10/22 1956)  lactated ringers bolus 1,000 mL (0 mLs Intravenous Stopped 11/10/22 2217)  lactated ringers bolus 1,052 mL (1,000 mLs Intravenous New Bag/Given 11/10/22 2145)  dextrose 5 % and 0.9% NaCl 5-0.9 % bolus 1,000 mL (1,000 mLs Intravenous New Bag/Given 11/10/22 2213)   Medical Decision Making:   Tanya Wilson is a 37 y.o. female who presented to the ED today with abdominal pain, detailed above.    Additional history discussed with patient's family/caregivers.  Patient placed on continuous vitals and telemetry monitoring while in ED which was reviewed periodically.  Complete initial physical exam performed, notably the patient  was HDS in NAD. Emesis during conversation.     Reviewed and confirmed nursing documentation for past medical history, family history, social history.    Initial Assessment:   With the patient's presentation of abdominal pain, most likely diagnosis is ETOH withdrawal vs pancreatitis. Other diagnoses were considered including (but not limited to) gastroenteritis, colitis,  small bowel obstruction, appendicitis, cholecystitis, pancreatitis, nephrolithiasis, UTI, pyleonephritis. These are considered less likely due to history of present illness and physical exam findings.   This is most consistent with an acute life/limb threatening illness complicated by underlying chronic conditions.   Initial Plan:  CBC/CMP to evaluate for underlying infectious/metabolic etiology for patient's abdominal pain  Lipase to evaluate for pancreatitis  EKG to evaluate for cardiac source of pain  CTAB/Pelvis with contrast to evaluate for structural/surgical etiology of patients' severe abdominal pain.  Urinalysis and repeat physical assessment to evaluate for UTI/Pyelonpehritis  Empiric management of symptoms with escalating pain control and antiemetics as needed.   Initial Study Results:   Laboratory  Severe CO2 called to me. Presentation now raising concern for euglycemic DKA given her use of empagliflozin in the outpatient setting. VBG ordered, DKA protocol ordered and beta-hydroxybutyrate added on. Continued fluid boluses.  She appears to be improving heart rate slowly downtrending.  Reassessment: Beta-hydroxybutyrate resulted grossly elevated, glucose stabilizing on insulin drip.  She converted to the dextrose containing fluid drip.  Likely some combination of DKA  and alcoholic ketoacidosis.  I considered sepsis, however she has afebrile throughout the duration of my evaluation has no localizing symptoms for sepsis.  Abdominal exam is benign, urine study benign and alternative diagnosis of DKA more consistent with the presentation.  Discussed with ICU who felt the patient can be managed on the hospitalist service.  Hospitalist accepted to admission.  Disposition:   Based on the above findings, I believe this patient is stable for admission.    Patient/family educated about specific findings on our evaluation and explained exact reasons for admission.  Patient/family educated  about clinical situation and time was allowed to answer questions.   Admission team communicated with and agreed with need for admission. Patient admitted. Patient  ready to move at this time.     Emergency Department Medication Summary:   Medications  insulin regular, human (MYXREDLIN) 100 units/ 100 mL infusion (3 Units/hr Intravenous Rate/Dose Change 11/10/22 2204)  lactated ringers infusion (0 mLs Intravenous Hold 11/10/22 2115)  dextrose 50 % solution 0-50 mL (has no administration in time range)  potassium chloride 10 mEq in 100 mL IVPB (10 mEq Intravenous New Bag/Given 11/10/22 2146)  ondansetron (ZOFRAN) injection 4 mg (4 mg Intravenous Given 11/10/22 1850)  diazepam (VALIUM) injection 5 mg (5 mg Intravenous Given 11/10/22 1851)  lactated ringers bolus 1,000 mL (0 mLs Intravenous Stopped 11/10/22 1956)  lactated ringers bolus 1,000 mL (0 mLs Intravenous Stopped 11/10/22 2217)  lactated ringers bolus 1,052 mL (1,000 mLs Intravenous New Bag/Given 11/10/22 2145)  dextrose 5 % and 0.9% NaCl 5-0.9 % bolus 1,000 mL (1,000 mLs Intravenous New Bag/Given 11/10/22 2213)          Clinical Impression:  1. Vomiting, unspecified vomiting type, unspecified whether nausea present      Admit   Final Clinical Impression(s) / ED Diagnoses Final diagnoses:  Vomiting, unspecified vomiting type, unspecified whether nausea present    Rx / DC Orders ED Discharge Orders     None         Tanya Ade, MD 11/10/22 2244

## 2022-11-10 NOTE — ED Notes (Signed)
[facility] at bedside 

## 2022-11-10 NOTE — Discharge Instructions (Addendum)
Number of changes have been made to your medications.  Please stop taking your Glyxambi immediately.  Instead, your diabetes medications will consist of Metformin, Saxagliptin and Glipizide.  You have also been placed on Omnicef for a suspected urinary tract infection.  You only have 2 doses left, please take it the morning of 10/21 and evening of 10/21. You must check your blood sugars regularly.  Check your blood sugar every morning before breakfast and recorded in a blood sugar diary.  Please present this blood sugar diary to your primary care provider upon follow-up for review and further adjustment of your medications.   Please consume a low carbohydrate diet Please abstain from regular alcohol use. Please return to the emergency department if you develop blood sugars of less than 80 that do not come up with drinking juice, blood sugars that are repeatedly over 400, generalized weakness, inability to tolerate oral intake, abdominal pain or fever.

## 2022-11-10 NOTE — Progress Notes (Signed)
Plan of Care Note for accepted transfer   Patient: Kristy Feijoo MRN: [id number]   DOA: 11/10/2022  Facility requesting transfer: [facility]   Requesting Provider: Dr. Doran Durand   Reason for transfer: Ketoacidosis   Facility course: 37 year old female with alcoholism, diabetes mellitus on empagliflozin, and anxiety who presents with 3 days of nausea and vomiting.  Last alcohol consumption was 11/07/2022.  She is tachycardic in the 120s and tachypneic in the 20s.  Labs are most notable for glucose 268, serum bicarbonate <7, incalculable anion gap, creatinine 1.57 (0.58 in July), and venous pH 6.884.  PCCM (Dr. Isaiah Serge) was consulted by the ED physician and advised that the patient could be admitted to the stepdown unit by the hospitalist service.  Patient was given Valium, 2 L of LR, and was started on IV insulin infusion in the ED.  Plan of care: The patient is accepted for admission to [facility] unit, at [facility].   Author: Briscoe Deutscher, MD 11/10/2022  Check [email or web address] for on-call coverage.  Nursing staff, Please call [facility] Admits & Consults System-Wide number on Amion as soon as patient's arrival, so appropriate admitting provider can evaluate the pt.

## 2022-11-11 ENCOUNTER — Inpatient Hospital Stay (HOSPITAL_COMMUNITY): Payer: No Typology Code available for payment source

## 2022-11-11 DIAGNOSIS — N179 Acute kidney failure, unspecified: Secondary | ICD-10-CM | POA: Diagnosis not present

## 2022-11-11 DIAGNOSIS — E1110 Type 2 diabetes mellitus with ketoacidosis without coma: Secondary | ICD-10-CM | POA: Diagnosis not present

## 2022-11-11 DIAGNOSIS — F1010 Alcohol abuse, uncomplicated: Secondary | ICD-10-CM | POA: Diagnosis not present

## 2022-11-11 DIAGNOSIS — F319 Bipolar disorder, unspecified: Secondary | ICD-10-CM

## 2022-11-11 DIAGNOSIS — E871 Hypo-osmolality and hyponatremia: Secondary | ICD-10-CM | POA: Diagnosis present

## 2022-11-11 LAB — GLUCOSE, CAPILLARY
Glucose-Capillary: 106 mg/dL — ABNORMAL HIGH (ref 70–99)
Glucose-Capillary: 119 mg/dL — ABNORMAL HIGH (ref 70–99)
Glucose-Capillary: 123 mg/dL — ABNORMAL HIGH (ref 70–99)
Glucose-Capillary: 129 mg/dL — ABNORMAL HIGH (ref 70–99)
Glucose-Capillary: 131 mg/dL — ABNORMAL HIGH (ref 70–99)
Glucose-Capillary: 133 mg/dL — ABNORMAL HIGH (ref 70–99)
Glucose-Capillary: 138 mg/dL — ABNORMAL HIGH (ref 70–99)
Glucose-Capillary: 138 mg/dL — ABNORMAL HIGH (ref 70–99)
Glucose-Capillary: 142 mg/dL — ABNORMAL HIGH (ref 70–99)
Glucose-Capillary: 144 mg/dL — ABNORMAL HIGH (ref 70–99)
Glucose-Capillary: 145 mg/dL — ABNORMAL HIGH (ref 70–99)
Glucose-Capillary: 152 mg/dL — ABNORMAL HIGH (ref 70–99)
Glucose-Capillary: 158 mg/dL — ABNORMAL HIGH (ref 70–99)
Glucose-Capillary: 162 mg/dL — ABNORMAL HIGH (ref 70–99)
Glucose-Capillary: 165 mg/dL — ABNORMAL HIGH (ref 70–99)
Glucose-Capillary: 169 mg/dL — ABNORMAL HIGH (ref 70–99)
Glucose-Capillary: 170 mg/dL — ABNORMAL HIGH (ref 70–99)
Glucose-Capillary: 171 mg/dL — ABNORMAL HIGH (ref 70–99)
Glucose-Capillary: 189 mg/dL — ABNORMAL HIGH (ref 70–99)
Glucose-Capillary: 204 mg/dL — ABNORMAL HIGH (ref 70–99)
Glucose-Capillary: 289 mg/dL — ABNORMAL HIGH (ref 70–99)

## 2022-11-11 LAB — BASIC METABOLIC PANEL
Anion gap: 18 — ABNORMAL HIGH (ref 5–15)
Anion gap: 17 — ABNORMAL HIGH (ref 5–15)
Anion gap: 13 (ref 5–15)
Anion gap: 9 (ref 5–15)
BUN: 19 mg/dL (ref 6–20)
BUN: 16 mg/dL (ref 6–20)
BUN: 15 mg/dL (ref 6–20)
BUN: 10 mg/dL (ref 6–20)
CO2: 9 mmol/L — ABNORMAL LOW (ref 22–32)
CO2: 10 mmol/L — ABNORMAL LOW (ref 22–32)
CO2: 14 mmol/L — ABNORMAL LOW (ref 22–32)
CO2: 17 mmol/L — ABNORMAL LOW (ref 22–32)
Calcium: 8.5 mg/dL — ABNORMAL LOW (ref 8.9–10.3)
Calcium: 8.6 mg/dL — ABNORMAL LOW (ref 8.9–10.3)
Calcium: 8.1 mg/dL — ABNORMAL LOW (ref 8.9–10.3)
Calcium: 8 mg/dL — ABNORMAL LOW (ref 8.9–10.3)
Chloride: 101 mmol/L (ref 98–111)
Chloride: 103 mmol/L (ref 98–111)
Chloride: 99 mmol/L (ref 98–111)
Chloride: 100 mmol/L (ref 98–111)
Creatinine, Ser: 0.89 mg/dL (ref 0.44–1.00)
Creatinine, Ser: 0.76 mg/dL (ref 0.44–1.00)
Creatinine, Ser: 0.65 mg/dL (ref 0.44–1.00)
Creatinine, Ser: 0.6 mg/dL (ref 0.44–1.00)
GFR, Estimated: >60 mL/min (ref >60)
GFR, Estimated: >60 mL/min (ref >60)
GFR, Estimated: >60 mL/min (ref >60)
GFR, Estimated: >60 mL/min (ref >60)
Glucose, Bld: 136 mg/dL — ABNORMAL HIGH (ref 70–99)
Glucose, Bld: 151 mg/dL — ABNORMAL HIGH (ref 70–99)
Glucose, Bld: 147 mg/dL — ABNORMAL HIGH (ref 70–99)
Glucose, Bld: 286 mg/dL — ABNORMAL HIGH (ref 70–99)
Potassium: 4.2 mmol/L (ref 3.5–5.1)
Potassium: 3.6 mmol/L (ref 3.5–5.1)
Potassium: 3 mmol/L — ABNORMAL LOW (ref 3.5–5.1)
Potassium: 3.3 mmol/L — ABNORMAL LOW (ref 3.5–5.1)
Sodium: 128 mmol/L — ABNORMAL LOW (ref 135–145)
Sodium: 130 mmol/L — ABNORMAL LOW (ref 135–145)
Sodium: 126 mmol/L — ABNORMAL LOW (ref 135–145)
Sodium: 126 mmol/L — ABNORMAL LOW (ref 135–145)

## 2022-11-11 LAB — URINALYSIS, ROUTINE W REFLEX MICROSCOPIC
Bilirubin Urine: NEGATIVE
Glucose, UA: >=500 mg/dL — AB
Ketones, ur: 80 mg/dL — AB
Nitrite: NEGATIVE
Protein, ur: 30 mg/dL — AB
RBC / HPF: >50 RBC/hpf (ref 0–5)
Specific Gravity, Urine: 1.02 (ref 1.005–1.030)
pH: 5 (ref 5.0–8.0)

## 2022-11-11 LAB — COMPREHENSIVE METABOLIC PANEL
ALT: 31 U/L (ref 0–44)
AST: 32 U/L (ref 15–41)
Albumin: 4.4 g/dL (ref 3.5–5.0)
Alkaline Phosphatase: 89 U/L (ref 38–126)
Anion gap: 20 — ABNORMAL HIGH (ref 5–15)
BUN: 25 mg/dL — ABNORMAL HIGH (ref 6–20)
CO2: 8 mmol/L — ABNORMAL LOW (ref 22–32)
Calcium: 8.1 mg/dL — ABNORMAL LOW (ref 8.9–10.3)
Chloride: 99 mmol/L (ref 98–111)
Creatinine, Ser: 0.95 mg/dL (ref 0.44–1.00)
GFR, Estimated: >60 mL/min (ref >60)
Glucose, Bld: 110 mg/dL — ABNORMAL HIGH (ref 70–99)
Potassium: 4.3 mmol/L (ref 3.5–5.1)
Sodium: 127 mmol/L — ABNORMAL LOW (ref 135–145)
Total Bilirubin: 1.7 mg/dL — ABNORMAL HIGH (ref 0.3–1.2)
Total Protein: 8.2 g/dL — ABNORMAL HIGH (ref 6.5–8.1)

## 2022-11-11 LAB — OSMOLALITY: Osmolality: 286 mosm/kg (ref 275–295)

## 2022-11-11 LAB — FOLATE: Folate: 29.8 ng/mL (ref >5.9)

## 2022-11-11 LAB — SODIUM, URINE, RANDOM: Sodium, Ur: 47 mmol/L

## 2022-11-11 LAB — PHOSPHORUS: Phosphorus: 1.4 mg/dL — ABNORMAL LOW (ref 2.5–4.6)

## 2022-11-11 LAB — BETA-HYDROXYBUTYRIC ACID
Beta-Hydroxybutyric Acid: 7.6 mmol/L — ABNORMAL HIGH (ref 0.05–0.27)
Beta-Hydroxybutyric Acid: 5.04 mmol/L — ABNORMAL HIGH (ref 0.05–0.27)
Beta-Hydroxybutyric Acid: 0.92 mmol/L — ABNORMAL HIGH (ref 0.05–0.27)

## 2022-11-11 LAB — MAGNESIUM: Magnesium: 2 mg/dL (ref 1.7–2.4)

## 2022-11-11 LAB — MRSA NEXT GEN BY PCR, NASAL: MRSA by PCR Next Gen: NOT DETECTED

## 2022-11-11 LAB — TSH: TSH: 1.654 u[IU]/mL (ref 0.350–4.500)

## 2022-11-11 LAB — CORTISOL: Cortisol, Plasma: 16.5 ug/dL

## 2022-11-11 LAB — OSMOLALITY, URINE: Osmolality, Ur: 618 mosm/kg (ref 300–900)

## 2022-11-11 LAB — VITAMIN B12: Vitamin B-12: 3629 pg/mL — ABNORMAL HIGH (ref 180–914)

## 2022-11-11 MED ORDER — DEXTROSE IN LACTATED RINGERS 5 % IV SOLN: INTRAVENOUS | Status: DC

## 2022-11-11 MED ORDER — NICOTINE 14 MG/24HR TD PT24
14.0000 mg | MEDICATED_PATCH | Freq: Every day | TRANSDERMAL | Status: DC
  Administered 2022-11-11 – 2022-11-12 (×2): 14 mg via TRANSDERMAL
  Filled 2022-11-11 (×2): qty 1

## 2022-11-11 MED ORDER — BUSPIRONE HCL 5 MG PO TABS
5.0000 mg | ORAL_TABLET | Freq: Three times a day (TID) | ORAL | Status: DC
  Administered 2022-11-11 – 2022-11-12 (×5): 5 mg via ORAL
  Filled 2022-11-11 (×5): qty 1

## 2022-11-11 MED ORDER — POTASSIUM CHLORIDE CRYS ER 20 MEQ PO TBCR
40.0000 meq | EXTENDED_RELEASE_TABLET | Freq: Once | ORAL | Status: AC
  Administered 2022-11-11: 40 meq via ORAL
  Filled 2022-11-11: qty 2

## 2022-11-11 MED ORDER — ENOXAPARIN SODIUM 40 MG/0.4ML IJ SOSY
40.0000 mg | PREFILLED_SYRINGE | Freq: Every day | INTRAMUSCULAR | Status: DC
  Administered 2022-11-11 – 2022-11-12 (×2): 40 mg via SUBCUTANEOUS
  Filled 2022-11-11 (×2): qty 0.4

## 2022-11-11 MED ORDER — ARIPIPRAZOLE 2 MG PO TABS: 2.0000 mg | ORAL_TABLET | Freq: Every day | ORAL | Status: DC

## 2022-11-11 MED ORDER — SODIUM PHOSPHATES 45 MMOLE/15ML IV SOLN
45.0000 mmol | Freq: Once | INTRAVENOUS | Status: AC
  Administered 2022-11-11: 45 mmol via INTRAVENOUS
  Filled 2022-11-11: qty 15

## 2022-11-11 MED ORDER — SODIUM CHLORIDE 0.9 % IV SOLN
1.0000 g | Freq: Every day | INTRAVENOUS | Status: DC
  Administered 2022-11-11 – 2022-11-12 (×2): 1 g via INTRAVENOUS
  Filled 2022-11-11 (×2): qty 10

## 2022-11-11 MED ORDER — INSULIN GLARGINE-YFGN 100 UNIT/ML ~~LOC~~ SOLN
10.0000 [IU] | Freq: Every day | SUBCUTANEOUS | Status: DC
  Administered 2022-11-11: 10 [IU] via SUBCUTANEOUS
  Filled 2022-11-11: qty 0.1

## 2022-11-11 MED ORDER — INSULIN GLARGINE-YFGN 100 UNIT/ML ~~LOC~~ SOLN
10.0000 [IU] | Freq: Every day | SUBCUTANEOUS | Status: DC
  Filled 2022-11-11 (×2): qty 0.1

## 2022-11-11 MED ORDER — INSULIN ASPART 100 UNIT/ML IJ SOLN
4.0000 [IU] | Freq: Three times a day (TID) | INTRAMUSCULAR | Status: DC
  Administered 2022-11-12 (×2): 4 [IU] via SUBCUTANEOUS

## 2022-11-11 MED ORDER — CHLORHEXIDINE GLUCONATE CLOTH 2 % EX PADS
6.0000 | MEDICATED_PAD | Freq: Every day | CUTANEOUS | Status: DC
  Administered 2022-11-11 – 2022-11-12 (×2): 6 via TOPICAL

## 2022-11-11 MED ORDER — INSULIN ASPART 100 UNIT/ML IJ SOLN
0.0000 [IU] | Freq: Three times a day (TID) | INTRAMUSCULAR | Status: DC
  Administered 2022-11-12: 2 [IU] via SUBCUTANEOUS
  Administered 2022-11-12: 5 [IU] via SUBCUTANEOUS

## 2022-11-11 NOTE — Progress Notes (Signed)
PROGRESS NOTE   Tanya Wilson  ZOX:[id number] DOB: 04-13-1985 DOA: 11/10/2022 PCP: Tracey Harries, MD   Date of Service: the patient was seen and examined on 11/11/2022  Brief Narrative:  37 year old female with past medical history of alcohol abuse, cigarette smoking (1ppd) , daily marijuana use, bipolar disorder presenting to the [facility] emergency department with nausea vomiting and shortness of breath.  Upon evaluation in the emergency department patient was found to be in diabetic ketoacidosis with a venous pH of 6.88 on chemistry.  DKA protocol was initiated with insulin infusion and intravenous fluids.  Hospitalist group was then called and patient was accepted for transfer to [facility] stepdown unit for further medical management.     Assessment and Plan: * Diabetic ketoacidosis without coma associated with type 2 diabetes mellitus (HCC) Patient presented with severe acidosis and severely elevated beta hydroxybutyrate despite only slightly elevated blood sugar of 268 consistent with euglycemic ketoacidosis likely secondary to the patient's ongoing use of Glyxambi Due to markedly elevated beta hydroxybutyrate continuing insulin infusion Patient is being hydrated aggressively with intravenous isotonic fluids Performing serial chemistries and serial beta hydroxybutyrate levels Patient has been advanced to clear liquids primarily because patient is currently normoglycemic despite ongoing DKA. Patient will remain on insulin infusion until anion gap is closed and beta hydroxybutyrate is approaching normalization.  AKI (acute kidney injury) (HCC) Improving with aggressive intravenous volume resuscitation Continue to monitor renal function and electrolytes with serial chemistries.  Alcohol abuse, continuous Last drink on Tuesday Continuing CIWA protocol As needed benzodiazepines to be administered for any evidence of withdrawal.  Hyponatremia Sodium still 128  despite now being euglycemic suggesting true hyponatremia Ordering hyponatremia workup  Bipolar 1 disorder (HCC) Will confirm patient has been taking documented BuSpar and Abilify and resume  Hypophosphatemia Phos 1.4 this morning, discussed with pharmacy and sodium phosphate will be administered.    Subjective:  Patient complains of severe generalized weakness for several days although states this is somewhat improved since yesterday..  Patient denies any associated abdominal pain.  P  Physical Exam:  Vitals:   11/11/22 1500 11/11/22 1600 11/11/22 1700 11/11/22 1813  BP: (!) 101/59 113/88 113/80   Pulse: 99 87 (!) 109   Resp: (!) 21 20 (!) 21   Temp:    98.6 F (37 C)  TempSrc:    Oral  SpO2: 100% 100% 100%   Weight:      Height:        Constitutional: Awake alert and oriented x3, no associated distress.   Skin: no rashes, no lesions, good skin turgor noted. Eyes: Pupils are equally reactive to light.  No evidence of scleral icterus or conjunctival pallor.  ENMT: Moist mucous membranes noted.  Posterior pharynx clear of any exudate or lesions.   Respiratory: clear to auscultation bilaterally, no wheezing, no crackles. Normal respiratory effort. No accessory muscle use.  Cardiovascular: Regular rate and rhythm, no murmurs / rubs / gallops. No extremity edema. 2+ pedal pulses. No carotid bruits.  Abdomen: Lower abdominal tenderness.  Abdomen is soft.  No evidence of intra-abdominal masses.  Positive bowel sounds noted in all quadrants.   Musculoskeletal: No joint deformity upper and lower extremities. Good ROM, no contractures. Normal muscle tone.    Data Reviewed:  I have personally reviewed and interpreted labs, imaging.  Significant findings are   CBC: Recent Labs  Lab 11/10/22 1840 11/10/22 2032  WBC 13.3*  --   HGB 16.8* 17.3*  HCT 49.8* 51.0*  MCV 104.6*  --   PLT 456*  --    Basic Metabolic Panel: Recent Labs  Lab 11/10/22 2030 11/10/22 2032  11/11/22 0011 11/11/22 0421 11/11/22 0812 11/11/22 1215  NA 132* 130* 127* 128* 130* 126*  K 4.8 4.6 4.3 4.2 3.6 3.0*  CL 93*  --  99 101 103 99  CO2 <7*  --  8* 9* 10* 14*  GLUCOSE 241*  --  110* 136* 151* 147*  BUN 33*  --  25* 19 16 15   CREATININE 1.37*  --  0.95 0.89 0.76 0.65  CALCIUM 8.8*  --  8.1* 8.5* 8.6* 8.1*  MG  --   --  2.0  --   --   --   PHOS  --   --  1.4*  --   --   --    GFR: Estimated Creatinine Clearance: 81.5 mL/min (by C-G formula based on SCr of 0.65 mg/dL). Liver Function Tests: Recent Labs  Lab 11/10/22 1840 11/11/22 0011  AST 41 32  ALT 40 31  ALKPHOS 131* 89  BILITOT 1.8* 1.7*  PROT 10.1* 8.2*  ALBUMIN 5.3* 4.4    Coagulation Profile: No results for input(s): "INR", "PROTIME" in the last 168 hours.   EKG/Telemetry: Personally reviewed.  Rhythm is sinus rhythm with heart rate of 100 bpm.  No dynamic ST segment changes appreciated.   Code Status:  Full code.  Code status decision has been confirmed with: patient    Severity of Illness:  The appropriate patient status for this patient is INPATIENT. Inpatient status is judged to be reasonable and necessary in order to provide the required intensity of service to ensure the patient's safety. The patient's presenting symptoms, physical exam findings, and initial radiographic and laboratory data in the context of their chronic comorbidities is felt to place them at high risk for further clinical deterioration. Furthermore, it is not anticipated that the patient will be medically stable for discharge from the hospital within 2 midnights of admission.   * I certify that at the point of admission it is my clinical judgment that the patient will require inpatient hospital care spanning beyond 2 midnights from the point of admission due to high intensity of service, high risk for further deterioration and high frequency of surveillance required.*  Time spent:  52 minutes  Author:  Marinda Elk  MD  11/11/2022 7:26 PM

## 2022-11-11 NOTE — Hospital Course (Addendum)
37 year old female with past medical history of alcohol abuse, cigarette smoking (1ppd) , daily marijuana use, bipolar disorder presenting to the [facility] emergency department with nausea vomiting and shortness of breath.  Upon evaluation in the emergency department patient was found to be in diabetic ketoacidosis with a venous pH of 6.88 on chemistry.  DKA protocol was initiated with insulin infusion and intravenous fluids.  The hospitalist group was then called and patient was accepted for transfer to [facility] stepdown unit for further medical management.  Patient continued to receive aggressive intravenous volume resuscitation and insulin infusion throughout the first 24 hours of hospitalization.  Evaluation for possible sources of infection did reveal urinalysis suggestive of urinary tract infection and therefore the patient was placed on intravenous ceftriaxone.  Patient was identified to be suffering from acute kidney injury on arrival and was hydrated aggressively with resolution of her acute kidney injury prior to discharge.  Upon thorough evaluation and the fact that the patient presented with relative "euglycemia" and simultaneous acute kidney injury, it was felt the likely culprit of the patient's near-euglycemic diabetic ketoacidosis was use of Glyxambi, a known issue with SGLT2 inhibitors.   The patient's anion gap slowly closed and beta hydroxybutyrate slowly began to trend downward.  Patient was transitioned from insulin infusion to basal bolus insulin therapy.  At time of discharge, considering the patient's hemoglobin A1c revealed somewhat reasonable control at 7.8% and considering the fact the patient is insisting she does not want to be on insulin she is being discharged on a primarily oral hypoglycemic regimen for continued management of her diabetes mellitus.  We are simply discontinuing the Glyxambi (containing the Empagliflozin) and instead are discharging the  patient on metformin, glipizide and saxagliptin.

## 2022-11-11 NOTE — Assessment & Plan Note (Signed)
Sodium still 128 despite now being euglycemic suggesting true hyponatremia Ordering hyponatremia workup

## 2022-11-11 NOTE — Assessment & Plan Note (Signed)
Will confirm patient has been taking documented BuSpar and Abilify and resume

## 2022-11-11 NOTE — Assessment & Plan Note (Signed)
Phos 1.4 this morning, discussed with pharmacy and sodium phosphate will be administered.

## 2022-11-11 NOTE — Assessment & Plan Note (Signed)
Improving with aggressive intravenous volume resuscitation Continue to monitor renal function and electrolytes with serial chemistries.

## 2022-11-11 NOTE — Assessment & Plan Note (Addendum)
Patient presented with severe acidosis and severely elevated beta hydroxybutyrate despite only slightly elevated blood sugar of 268 consistent with euglycemic ketoacidosis likely secondary to the patient's ongoing use of Glyxambi Due to markedly elevated beta hydroxybutyrate continuing insulin infusion Patient is being hydrated aggressively with intravenous isotonic fluids Performing serial chemistries and serial beta hydroxybutyrate levels Patient has been advanced to clear liquids primarily because patient is currently normoglycemic despite ongoing DKA. Patient will remain on insulin infusion until anion gap is closed and beta hydroxybutyrate is approaching normalization.

## 2022-11-11 NOTE — Assessment & Plan Note (Signed)
Last drink on Tuesday Continuing CIWA protocol As needed benzodiazepines to be administered for any evidence of withdrawal.

## 2022-11-12 DIAGNOSIS — N3000 Acute cystitis without hematuria: Secondary | ICD-10-CM | POA: Diagnosis present

## 2022-11-12 DIAGNOSIS — E871 Hypo-osmolality and hyponatremia: Secondary | ICD-10-CM | POA: Diagnosis not present

## 2022-11-12 DIAGNOSIS — N179 Acute kidney failure, unspecified: Secondary | ICD-10-CM | POA: Diagnosis not present

## 2022-11-12 DIAGNOSIS — F1010 Alcohol abuse, uncomplicated: Secondary | ICD-10-CM | POA: Diagnosis not present

## 2022-11-12 DIAGNOSIS — E1110 Type 2 diabetes mellitus with ketoacidosis without coma: Secondary | ICD-10-CM | POA: Diagnosis not present

## 2022-11-12 LAB — BASIC METABOLIC PANEL
Anion gap: 7 (ref 5–15)
Anion gap: 9 (ref 5–15)
BUN: 8 mg/dL (ref 6–20)
BUN: 9 mg/dL (ref 6–20)
CO2: 21 mmol/L — ABNORMAL LOW (ref 22–32)
CO2: 20 mmol/L — ABNORMAL LOW (ref 22–32)
Calcium: 8.5 mg/dL — ABNORMAL LOW (ref 8.9–10.3)
Calcium: 8.5 mg/dL — ABNORMAL LOW (ref 8.9–10.3)
Chloride: 104 mmol/L (ref 98–111)
Chloride: 103 mmol/L (ref 98–111)
Creatinine, Ser: 0.5 mg/dL (ref 0.44–1.00)
Creatinine, Ser: 0.48 mg/dL (ref 0.44–1.00)
GFR, Estimated: >60 mL/min (ref >60)
GFR, Estimated: >60 mL/min (ref >60)
Glucose, Bld: 138 mg/dL — ABNORMAL HIGH (ref 70–99)
Glucose, Bld: 116 mg/dL — ABNORMAL HIGH (ref 70–99)
Potassium: 3.7 mmol/L (ref 3.5–5.1)
Potassium: 3.7 mmol/L (ref 3.5–5.1)
Sodium: 132 mmol/L — ABNORMAL LOW (ref 135–145)
Sodium: 132 mmol/L — ABNORMAL LOW (ref 135–145)

## 2022-11-12 LAB — CBC WITH DIFFERENTIAL/PLATELET
Abs Immature Granulocytes: 0.02 10*3/uL (ref 0.00–0.07)
Basophils Absolute: 0 10*3/uL (ref 0.0–0.1)
Basophils Relative: 0 %
Eosinophils Absolute: 0.1 10*3/uL (ref 0.0–0.5)
Eosinophils Relative: 1 %
HCT: 34.5 % — ABNORMAL LOW (ref 36.0–46.0)
Hemoglobin: 12 g/dL (ref 12.0–15.0)
Immature Granulocytes: 0 %
Lymphocytes Relative: 20 %
Lymphs Abs: 1.2 10*3/uL (ref 0.7–4.0)
MCH: 35.4 pg — ABNORMAL HIGH (ref 26.0–34.0)
MCHC: 34.8 g/dL (ref 30.0–36.0)
MCV: 101.8 fL — ABNORMAL HIGH (ref 80.0–100.0)
Monocytes Absolute: 0.8 10*3/uL (ref 0.1–1.0)
Monocytes Relative: 12 %
Neutro Abs: 4 10*3/uL (ref 1.7–7.7)
Neutrophils Relative %: 67 %
Platelets: 217 10*3/uL (ref 150–400)
RBC: 3.39 MIL/uL — ABNORMAL LOW (ref 3.87–5.11)
RDW: 11.7 % (ref 11.5–15.5)
WBC: 6.1 10*3/uL (ref 4.0–10.5)
nRBC: 0 % (ref 0.0–0.2)

## 2022-11-12 LAB — COMPREHENSIVE METABOLIC PANEL
ALT: 25 U/L (ref 0–44)
AST: 44 U/L — ABNORMAL HIGH (ref 15–41)
Albumin: 3.3 g/dL — ABNORMAL LOW (ref 3.5–5.0)
Alkaline Phosphatase: 65 U/L (ref 38–126)
Anion gap: 8 (ref 5–15)
BUN: 8 mg/dL (ref 6–20)
CO2: 20 mmol/L — ABNORMAL LOW (ref 22–32)
Calcium: 8.5 mg/dL — ABNORMAL LOW (ref 8.9–10.3)
Chloride: 105 mmol/L (ref 98–111)
Creatinine, Ser: 0.53 mg/dL (ref 0.44–1.00)
GFR, Estimated: >60 mL/min (ref >60)
Glucose, Bld: 103 mg/dL — ABNORMAL HIGH (ref 70–99)
Potassium: 3.7 mmol/L (ref 3.5–5.1)
Sodium: 133 mmol/L — ABNORMAL LOW (ref 135–145)
Total Bilirubin: 1 mg/dL (ref 0.3–1.2)
Total Protein: 6.4 g/dL — ABNORMAL LOW (ref 6.5–8.1)

## 2022-11-12 LAB — GLUCOSE, CAPILLARY
Glucose-Capillary: 115 mg/dL — ABNORMAL HIGH (ref 70–99)
Glucose-Capillary: 136 mg/dL — ABNORMAL HIGH (ref 70–99)
Glucose-Capillary: 250 mg/dL — ABNORMAL HIGH (ref 70–99)

## 2022-11-12 LAB — HEMOGLOBIN A1C
Hgb A1c MFr Bld: 7.8 % — ABNORMAL HIGH (ref 4.8–5.6)
Mean Plasma Glucose: 177.16 mg/dL

## 2022-11-12 LAB — BETA-HYDROXYBUTYRIC ACID: Beta-Hydroxybutyric Acid: 2.34 mmol/L — ABNORMAL HIGH (ref 0.05–0.27)

## 2022-11-12 LAB — PHOSPHORUS: Phosphorus: <1 mg/dL — CL (ref 2.5–4.6)

## 2022-11-12 LAB — MAGNESIUM: Magnesium: 2.2 mg/dL (ref 1.7–2.4)

## 2022-11-12 MED ORDER — METFORMIN HCL ER 500 MG PO TB24: 500.0000 mg | ORAL_TABLET | Freq: Every day | ORAL | 2 refills | Status: AC

## 2022-11-12 MED ORDER — BLOOD GLUCOSE MONITORING SUPPL DEVI: 1.0000 | Freq: Every day | 0 refills | Status: AC

## 2022-11-12 MED ORDER — SAXAGLIPTIN HCL 5 MG PO TABS: 5.0000 mg | ORAL_TABLET | Freq: Every day | ORAL | 2 refills | Status: AC

## 2022-11-12 MED ORDER — POTASSIUM PHOSPHATES 15 MMOLE/5ML IV SOLN
45.0000 mmol | Freq: Once | INTRAVENOUS | Status: AC
  Administered 2022-11-12: 45 mmol via INTRAVENOUS
  Filled 2022-11-12: qty 15

## 2022-11-12 MED ORDER — METFORMIN HCL ER 500 MG PO TB24: 500.0000 mg | ORAL_TABLET | Freq: Every day | ORAL | Status: DC

## 2022-11-12 MED ORDER — LINAGLIPTIN 5 MG PO TABS: 5.0000 mg | ORAL_TABLET | Freq: Every day | ORAL | Status: DC

## 2022-11-12 MED ORDER — LANCETS MISC: 1.0000 | Freq: Every day | 0 refills | Status: AC

## 2022-11-12 MED ORDER — GLIPIZIDE ER 5 MG PO TB24: 5.0000 mg | ORAL_TABLET | Freq: Every day | ORAL | Status: DC

## 2022-11-12 MED ORDER — GLIPIZIDE ER 5 MG PO TB24: 5.0000 mg | ORAL_TABLET | Freq: Every day | ORAL | 2 refills | Status: AC

## 2022-11-12 MED ORDER — BLOOD GLUCOSE TEST VI STRP: 1.0000 | ORAL_STRIP | Freq: Every day | 0 refills | Status: AC

## 2022-11-12 MED ORDER — CEFDINIR 300 MG PO CAPS: 300.0000 mg | ORAL_CAPSULE | Freq: Two times a day (BID) | ORAL | 0 refills | Status: AC

## 2022-11-12 NOTE — Progress Notes (Signed)
Educated patient on discharge instructions. Patient's peripheral ivs were removed. All of patient's belongings were in patient's possession upon discharge. Patient brought to main entrance in wheelchair. Patient's mom picked patient up at the main entrance.

## 2022-11-12 NOTE — Discharge Summary (Addendum)
Physician Discharge Summary   Patient: Tanya Wilson MRN: [id number] DOB: 17-Dec-1985  Admit date:     11/10/2022  Discharge date: 11/12/22  Discharge Physician: Marinda Elk   PCP: Tracey Harries, MD   Recommendations at discharge:   Number of changes have been made to your medications.  Please stop taking your Glyxambi immediately.  Instead, your diabetes medications will consist of Metformin, Saxagliptin and Glipizide.  You must check your blood sugars regularly.  Check your blood sugar every morning before breakfast and recorded in a blood sugar diary.  Please present this blood sugar diary to your primary care provider upon follow-up for review and further adjustment of your medications.   Please consume a low carbohydrate diet Please abstain from regular alcohol use. Please return to the emergency department if you develop blood sugars of less than 80 that do not come up with drinking juice, blood sugars that are repeatedly over 400, generalized weakness, inability to tolerate oral intake, abdominal pain or fever.  Discharge Diagnoses: Principal Problem:   Diabetic ketoacidosis without coma associated with type 2 diabetes mellitus (HCC) Active Problems:   AKI (acute kidney injury) (HCC)   Alcohol abuse, continuous   Hyponatremia   Bipolar 1 disorder (HCC)   Hypophosphatemia  Resolved Problems:   * No resolved hospital problems. *   Hospital Course: 37 year old female with past medical history of alcohol abuse, cigarette smoking (1ppd) , daily marijuana use, bipolar disorder presenting to the [facility] emergency department with nausea vomiting and shortness of breath.  Upon evaluation in the emergency department patient was found to be in diabetic ketoacidosis with a venous pH of 6.88 on chemistry.  DKA protocol was initiated with insulin infusion and intravenous fluids.  The hospitalist group was then called and patient was accepted for transfer to [facility] stepdown unit for further medical management.  Patient continued to receive aggressive intravenous volume resuscitation and insulin infusion throughout the first 24 hours of hospitalization.  Evaluation for possible sources of infection did reveal urinalysis suggestive of urinary tract infection and therefore the patient was placed on intravenous ceftriaxone.  Patient was identified to be suffering from acute kidney injury on arrival and was hydrated aggressively with resolution of her acute kidney injury prior to discharge.  Upon thorough evaluation and the fact that the patient presented with relative "euglycemia" and simultaneous acute kidney injury, it was felt the likely culprit of the patient's near-euglycemic diabetic ketoacidosis was use of Glyxambi, a known issue with SGLT2 inhibitors.   The patient's anion gap slowly closed and beta hydroxybutyrate slowly began to trend downward.  Patient was transitioned from insulin infusion to basal bolus insulin therapy.  At time of discharge, considering the patient's hemoglobin A1c revealed somewhat reasonable control at 7.8% and considering the fact the patient is insisting she does not want to be on insulin she is being discharged on a primarily oral hypoglycemic regimen for continued management of her diabetes mellitus.  We are simply discontinuing the Glyxambi (containing the Empagliflozin) and instead are discharging the patient on metformin, glipizide and saxagliptin.  Patient was also discharged on Omnicef to complete her course of antibiotics for her urinary tract infection.      Consultants: none Procedures performed: None  Disposition: Home Diet recommendation:  Discharge Diet Orders (From admission, onward)     Start     Ordered   11/12/22 0000  Diet - low sodium heart healthy        11/12/22  1608           Carb modified diet  DISCHARGE MEDICATION: Allergies as of 11/12/2022       Reactions   Penicillins Hives         Medication List     STOP taking these medications    Glyxambi 25-5 MG Tabs Generic drug: Empagliflozin-linaGLIPtin       TAKE these medications    Blood Glucose Monitoring Suppl Devi 1 each by Does not apply route daily before breakfast. May dispense any manufacturer covered by patient's insurance.   BLOOD GLUCOSE TEST STRIPS Strp 1 each by Does not apply route daily before breakfast. Use as directed to check blood sugar. May dispense any manufacturer covered by patient's insurance and fits patient's device.   busPIRone 5 MG tablet Commonly known as: BUSPAR Take 5 mg by mouth 3 (three) times daily.   cefdinir 300 MG capsule Commonly known as: OMNICEF Take 1 capsule (300 mg total) by mouth 2 (two) times daily for 2 doses. Begin the AM of 10/21. Start taking on: November 13, 2022   glipiZIDE 5 MG 24 hr tablet Commonly known as: GLUCOTROL XL Take 1 tablet (5 mg total) by mouth daily with breakfast. Start taking on: November 13, 2022   Lancets Misc 1 each by Does not apply route daily before breakfast. Use as directed to check blood sugar. May dispense any manufacturer covered by patient's insurance and fits patient's device.   metFORMIN 500 MG 24 hr tablet Commonly known as: GLUCOPHAGE-XR Take 1 tablet (500 mg total) by mouth daily with breakfast. Start taking on: November 13, 2022 What changed: when to take this   omeprazole 40 MG capsule Commonly known as: PRILOSEC Take 40 mg by mouth daily.   ondansetron 4 MG disintegrating tablet Commonly known as: ZOFRAN-ODT Take 4 mg by mouth every 8 (eight) hours as needed for nausea or vomiting.   saxagliptin HCl 5 MG Tabs tablet Commonly known as: ONGLYZA Take 1 tablet (5 mg total) by mouth daily.        Follow-up Information     Connect with your PCP/Specialist as discussed. Schedule an appointment as soon as possible for a visit .   Contact information: [email or web address] Call our  physician referral line at [phone].        Tracey Harries, MD. Schedule an appointment as soon as possible for a visit in 1 week(s).   Specialty: Family Medicine Contact information: [address] [place] [address] [phone]                 Discharge Exam: Ceasar Mons Weights   11/10/22 1822 11/10/22 2306  Weight: 52.6 kg 54.9 kg    Constitutional: Awake alert and oriented x3, no associated distress.   Respiratory: clear to auscultation bilaterally, no wheezing, no crackles. Normal respiratory effort. No accessory muscle use.  Cardiovascular: Regular rate and rhythm, no murmurs / rubs / gallops. No extremity edema. 2+ pedal pulses. No carotid bruits.  Abdomen: Abdomen is soft and nontender.  No evidence of intra-abdominal masses.  Positive bowel sounds noted in all quadrants.   Musculoskeletal: No joint deformity upper and lower extremities. Good ROM, no contractures. Normal muscle tone.     Condition at discharge: fair  The results of significant diagnostics from this hospitalization (including imaging, microbiology, ancillary and laboratory) are listed below for reference.   Imaging Studies: DG Chest 1 View  Result Date: 11/11/2022 CLINICAL DATA:  Pneumonia.  Patient reports shortness of  breath. EXAM: CHEST  1 VIEW COMPARISON:  Chest radiograph 11/09/2011 FINDINGS: The cardiomediastinal contours are normal. The lungs are clear. Pulmonary vasculature is normal. No consolidation, pleural effusion, or pneumothorax. No acute osseous abnormalities are seen. IMPRESSION: Clear lungs, no acute finding. Specifically, no evidence of pneumonia. Electronically Signed   By: Narda Rutherford M.D.   On: 11/11/2022 12:05    Microbiology: Results for orders placed or performed during the hospital encounter of 11/10/22  MRSA Next Gen by PCR, Nasal     Status: None   Collection Time: 11/11/22  5:40 PM   Specimen: Nasal Mucosa; Nasal Swab  Result Value Ref  Range Status   MRSA by PCR Next Gen NOT DETECTED NOT DETECTED Final    Comment: (NOTE) The GeneXpert MRSA Assay (FDA approved for NASAL specimens only), is one component of a comprehensive MRSA colonization surveillance program. It is not intended to diagnose MRSA infection nor to guide or monitor treatment for MRSA infections. Test performance is not FDA approved in patients less than 30 years old. Performed at [facility], [address], [place], [place] [address]     Labs: CBC: Recent Labs  Lab 11/10/22 1840 11/10/22 2032 11/12/22 0435  WBC 13.3*  --  6.1  NEUTROABS  --   --  4.0  HGB 16.8* 17.3* 12.0  HCT 49.8* 51.0* 34.5*  MCV 104.6*  --  101.8*  PLT 456*  --  217   Basic Metabolic Panel: Recent Labs  Lab 11/11/22 0011 11/11/22 0421 11/11/22 1215 11/11/22 1905 11/12/22 0011 11/12/22 0435 11/12/22 0740  NA 127*   < > 126* 126* 132* 133* 132*  K 4.3   < > 3.0* 3.3* 3.7 3.7 3.7  CL 99   < > 99 100 104 105 103  CO2 8*   < > 14* 17* 21* 20* 20*  GLUCOSE 110*   < > 147* 286* 138* 103* 116*  BUN 25*   < > 15 10 8 8 9   CREATININE 0.95   < > 0.65 0.60 0.50 0.53 0.48  CALCIUM 8.1*   < > 8.1* 8.0* 8.5* 8.5* 8.5*  MG 2.0  --   --   --   --  2.2  --   PHOS 1.4*  --   --   --   --  <1.0*  --    < > = values in this interval not displayed.   Liver Function Tests: Recent Labs  Lab 11/10/22 1840 11/11/22 0011 11/12/22 0435  AST 41 32 44*  ALT 40 31 25  ALKPHOS 131* 89 65  BILITOT 1.8* 1.7* 1.0  PROT 10.1* 8.2* 6.4*  ALBUMIN 5.3* 4.4 3.3*   CBG: Recent Labs  Lab 11/11/22 2207 11/11/22 2304 11/12/22 0009 11/12/22 0825 11/12/22 1144  GLUCAP 133* 138* 136* 115* 250*    Discharge time spent: greater than 30 minutes.  Signed: Marinda Elk, MD [facility] 11/12/2022

## 2022-11-12 NOTE — TOC Initial Note (Signed)
Transition of Care [facility]) - Initial/Assessment Note    Patient Details  Name: Tanya Wilson MRN: [id number] Date of Birth: Jun 06, 1985  Transition of [facility]) CM/SW Contact:    Georgie Chard, LCSW Phone Number: 11/12/2022, 12:59 PM  Clinical Narrative:                 CSW spoke to patient in regards to SUD consult. At this time patient would like the SUD resources. Patient has no other needs at this time. TOC will sign off.   Expected Discharge Plan: Home/Self Care Barriers to Discharge: No Barriers Identified   Patient Goals and CMS Choice Patient states their goals for this hospitalization and ongoing recovery are:: Would like SUD resources          Expected Discharge Plan and Services                                              Prior Living Arrangements/Services   Lives with:: Self          Need for Family Participation in Patient Care: No (Comment) Care giver support system in place?: Yes (comment)   Criminal Activity/Legal Involvement Pertinent to Current Situation/Hospitalization: No - Comment as needed  Activities of Daily Living   ADL Screening (condition at time of admission) Independently performs ADLs?: Yes (appropriate for developmental age) Is the patient deaf or have difficulty hearing?: No Does the patient have difficulty seeing, even when wearing glasses/contacts?: No Does the patient have difficulty concentrating, remembering, or making decisions?: No  Permission Sought/Granted                  Emotional Assessment Appearance:: Appears stated age   Affect (typically observed): Accepting   Alcohol / Substance Use: Tobacco Use, Alcohol Use Psych Involvement: No (comment)  Admission diagnosis:  Alcoholic ketoacidosis [E87.29] Vomiting, unspecified vomiting type, unspecified whether nausea present [R11.10] Patient Active Problem List   Diagnosis Date Noted   AKI (acute kidney injury) (HCC) 11/11/2022   Hyponatremia  11/11/2022   Hypophosphatemia 11/11/2022   Diabetic ketoacidosis without coma associated with type 2 diabetes mellitus (HCC) 11/10/2022   Bipolar 1 disorder (HCC) 11/10/2022   Alcohol abuse, continuous 09/26/2011    Class: Acute   PCP:  Tracey Harries, MD Pharmacy:   [facility] [phone] - [place], [place] - [address] [place] [address] Phone: [phone] Fax: [phone]  [facility] [id number] - [place], [place] - [address] [place] [address] Phone: [phone] Fax: [phone]     Social Determinants of Health (SDOH) Social History: SDOH Screenings   Food Insecurity: No Food Insecurity (11/10/2022)  Housing: Low Risk  (11/10/2022)  Transportation Needs: No Transportation Needs (11/10/2022)  Utilities: Not At Risk (11/10/2022)  Depression (PHQ2-9): Medium Risk (09/13/2020)  Financial Resource Strain: Low Risk  (08/11/2022)   Received from [facility]  Physical Activity: Inactive (08/11/2022)   Received from [facility]  Social Connections: Unknown (08/11/2022)   Received from [facility]  Stress: Stress Concern Present (08/11/2022)   Received from [facility]  Tobacco Use: High Risk (11/10/2022)   SDOH Interventions:     Readmission Risk Interventions    11/12/2022   12:57 PM  Readmission Risk Prevention Plan  Post Dischage Appt Complete  Medication Screening Complete  Transportation Screening Complete

## 2022-11-13 LAB — URINE CULTURE: Culture: <10000 — AB
